# Patient Record
Sex: Male | Born: 1995 | Race: White | Hispanic: No | Marital: Single | Smoking: Never smoker
Health system: Southern US, Community
[De-identification: ages and names within clinical notes are randomized; demographics above are authoritative.]

## PROBLEM LIST (undated history)

## (undated) DIAGNOSIS — F909 Attention-deficit hyperactivity disorder, unspecified type: Secondary | ICD-10-CM

## (undated) HISTORY — DX: Attention-deficit hyperactivity disorder, unspecified type: F90.9

---
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
## 2013-11-16 ENCOUNTER — Ambulatory Visit (INDEPENDENT_AMBULATORY_CARE_PROVIDER_SITE_OTHER): Payer: 59 | Admitting: Family Medicine

## 2013-11-16 ENCOUNTER — Ambulatory Visit: Payer: 59

## 2013-11-16 VITALS — BP 110/66 | HR 67 | Temp 98.1°F | Resp 16 | Ht 71.5 in | Wt 154.4 lb

## 2013-11-16 DIAGNOSIS — IMO0002 Reserved for concepts with insufficient information to code with codable children: Secondary | ICD-10-CM

## 2013-11-16 DIAGNOSIS — S0091XA Abrasion of unspecified part of head, initial encounter: Secondary | ICD-10-CM

## 2013-11-16 DIAGNOSIS — R110 Nausea: Secondary | ICD-10-CM

## 2013-11-16 DIAGNOSIS — M79609 Pain in unspecified limb: Secondary | ICD-10-CM

## 2013-11-16 DIAGNOSIS — M79643 Pain in unspecified hand: Secondary | ICD-10-CM

## 2013-11-16 DIAGNOSIS — S62339A Displaced fracture of neck of unspecified metacarpal bone, initial encounter for closed fracture: Secondary | ICD-10-CM

## 2013-11-16 DIAGNOSIS — S62309A Unspecified fracture of unspecified metacarpal bone, initial encounter for closed fracture: Secondary | ICD-10-CM

## 2013-11-16 MED ORDER — HYDROCODONE-ACETAMINOPHEN 5-325 MG PO TABS: 1.0000 | ORAL_TABLET | Freq: Four times a day (QID) | ORAL | Status: DC | PRN

## 2013-11-16 NOTE — Patient Instructions (Addendum)
Boxer's Fracture You have a break (fracture) of the fifth metacarpal bone. This is commonly called a boxer's fracture. This is the bone in the hand where the little finger attaches. The fracture is in the end of that bone, closest to the little finger. It is usually caused when you hit an object with a clenched fist. Often, the knuckle is pushed down by the impact. Sometimes, the fracture rotates out of position. A boxer's fracture will usually heal within 6 weeks, if it is treated properly and protected from re-injury. Surgery is sometimes needed. A cast, splint, or bulky hand dressing may be used to protect and immobilize a boxer's fracture. Do not remove this device or dressing until your caregiver approves. Keep your hand elevated, and apply ice packs for 15-20 minutes every 2 hours, for the first 2 days. Elevation and ice help reduce swelling and relieve pain. See your caregiver, or an orthopedic specialist, for follow-up care within the next 10 days. This is to make sure your fracture is healing properly. Document Released: 08/24/2005 Document Revised: 11/16/2011 Document Reviewed: 02/11/2007 [facility] Patient Information 2014 [place], [place]. Concussion, Adult A concussion, or closed-head injury, is a brain injury caused by a direct blow to the head or by a quick and sudden movement (jolt) of the head or neck. Concussions are usually not life-threatening. Even so, the effects of a concussion can be serious. If you have had a concussion before, you are more likely to experience concussion-like symptoms after a direct blow to the head.  CAUSES   Direct blow to the head, such as from running into another player during a soccer game, being hit in a fight, or hitting your head on a hard surface.  A jolt of the head or neck that causes the brain to move back and forth inside the skull, such as in a car crash. SIGNS AND SYMPTOMS  The signs of a concussion can be hard to notice. Early on, they may be  missed by you, family members, and health care providers. You may look fine but act or feel differently. Symptoms are usually temporary, but they may last for days, weeks, or even longer. Some symptoms may appear right away while others may not show up for hours or days. Every head injury is different. Symptoms include:   Mild to moderate headaches that will not go away.  A feeling of pressure inside your head.  Having more trouble than usual:   Learning or remembering things you have heard.  Answering questions.  Paying attention or concentrating.   Organizing daily tasks.   Making decisions and solving problems.   Slowness in thinking, acting or reacting, speaking, or reading.   Getting lost or being easily confused.   Feeling tired all the time or lacking energy (fatigued).   Feeling drowsy.   Sleep disturbances.   Sleeping more than usual.   Sleeping less than usual.   Trouble falling asleep.   Trouble sleeping (insomnia).   Loss of balance or feeling lightheaded or dizzy.   Nausea or vomiting.   Numbness or tingling.   Increased sensitivity to:   Sounds.   Lights.   Distractions.   Vision problems or eyes that tire easily.   Diminished sense of taste or smell.   Ringing in the ears.   Mood changes such as feeling sad or anxious.   Becoming easily irritated or angry for little or no reason.   Lack of motivation.  Seeing or hearing things other people  do not see or hear (hallucinations). DIAGNOSIS  Your health care provider can usually diagnose a concussion based on a description of your injury and symptoms. He or she will ask whether you passed out (lost consciousness) and whether you are having trouble remembering events that happened right before and during your injury.  Your evaluation might include:   A brain scan to look for signs of injury to the brain. Even if the test shows no injury, you may still have a  concussion.   Blood tests to be sure other problems are not present. TREATMENT   Concussions are usually treated in an emergency department, in urgent care, or at a clinic. You may need to stay in the hospital overnight for further treatment.   Tell your health care provider if you are taking any medicines, including prescription medicines, over-the-counter medicines, and natural remedies. Some medicines, such as blood thinners (anticoagulants) and aspirin, may increase the chance of complications. Also tell your health care provider whether you have had alcohol or are taking illegal drugs. This information may affect treatment.  Your health care provider will send you home with important instructions to follow.  How fast you will recover from a concussion depends on many factors. These factors include how severe your concussion is, what part of your brain was injured, your age, and how healthy you were before the concussion.  Most people with mild injuries recover fully. Recovery can take time. In general, recovery is slower in older persons. Also, persons who have had a concussion in the past or have other medical problems may find that it takes longer to recover from their current injury. HOME CARE INSTRUCTIONS  General Instructions  Carefully follow the directions your health care provider gave you.  Only take over-the-counter or prescription medicines for pain, discomfort, or fever as directed by your health care provider.  Take only those medicines that your health care provider has approved.  Do not drink alcohol until your health care provider says you are well enough to do so. Alcohol and certain other drugs may slow your recovery and can put you at risk of further injury.  If it is harder than usual to remember things, write them down.  If you are easily distracted, try to do one thing at a time. For example, do not try to watch TV while fixing dinner.  Talk with family  members or close friends when making important decisions.  Keep all follow-up appointments. Repeated evaluation of your symptoms is recommended for your recovery.  Watch your symptoms and tell others to do the same. Complications sometimes occur after a concussion. Older adults with a brain injury may have a higher risk of serious complications such as of a blood clot on the brain.  Tell your teachers, school nurse, school counselor, coach, athletic trainer, or work Production designer, theatre/television/filmmanager about your injury, symptoms, and restrictions. Tell them about what you can or cannot do. They should watch for:   Increased problems with attention or concentration.   Increased difficulty remembering or learning new information.   Increased time needed to complete tasks or assignments.   Increased irritability or decreased ability to cope with stress.   Increased symptoms.   Rest. Rest helps the brain to heal. Make sure you:  Get plenty of sleep at night. Avoid staying up late at night.  Keep the same bedtime hours on weekends and weekdays.  Rest during the day. Take daytime naps or rest breaks when you feel tired.  Limit activities that require a lot of thought or concentration. These includes   Doing homework or job-related work.   Watching TV.   Working on the computer.  Avoid any situation where there is potential for another head injury (football, hockey, soccer, basketball, martial arts, downhill snow sports and horseback riding). Your condition will get worse every time you experience a concussion. You should avoid these activities until you are evaluated by the appropriate follow-up caregivers. Returning To Your Regular Activities You will need to return to your normal activities slowly, not all at once. You must give your body and brain enough time for recovery.  Do not return to sports or other athletic activities until your health care provider tells you it is safe to do so.  Ask your  health care provider when you can drive, ride a bicycle, or operate heavy machinery. Your ability to react may be slower after a brain injury. Never do these activities if you are dizzy.  Ask your health care provider about when you can return to work or school. Preventing Another Concussion It is very important to avoid another brain injury, especially before you have recovered. In rare cases, another injury can lead to permanent brain damage, brain swelling, or death. The risk of this is greatest during the first 7 10 days after a head injury. Avoid injuries by:   Wearing a seat belt when riding in a car.   Drinking alcohol only in moderation.   Wearing a helmet when biking, skiing, skateboarding, skating, or doing similar activities.  Avoiding activities that could lead to a second concussion, such as contact or recreational sports, until your health care provider says it is OK.  Taking safety measures in your home.   Remove clutter and tripping hazards from floors and stairways.   Use grab bars in bathrooms and handrails by stairs.   Place non-slip mats on floors and in bathtubs.   Improve lighting in dim areas. SEEK MEDICAL CARE IF:   You have increased problems paying attention or concentrating.   You have increased difficulty remembering or learning new information.   You need more time to complete tasks or assignments than before.   You have increased irritability or decreased ability to cope with stress.  You have more symptoms than before. Seek medical care if you have any of the following symptoms for more than 2 weeks after your injury:   Lasting (chronic) headaches.   Dizziness or balance problems.   Nausea.  Vision problems.   Increased sensitivity to noise or light.   Depression or mood swings.   Anxiety or irritability.   Memory problems.   Difficulty concentrating or paying attention.   Sleep problems.   Feeling tired all the  time. SEEK IMMEDIATE MEDICAL CARE IF:   You have severe or worsening headaches. These may be a sign of a blood clot in the brain.  You have weakness (even if only in one hand, leg, or part of the face).  You have numbness.  You have decreased coordination.   You vomit repeatedly.  You have increased sleepiness.  One pupil is larger than the other.   You have convulsions.   You have slurred speech.   You have increased confusion. This may be a sign of a blood clot in the brain.  You have increased restlessness, agitation, or irritability.   You are unable to recognize people or places.   You have neck pain.   It is difficult to  wake you up.   You have unusual behavior changes.   You lose consciousness. MAKE SURE YOU:   Understand these instructions.  Will watch your condition.  Will get help right away if you are not doing well or get worse. Document Released: 11/14/2003 Document Revised: 04/26/2013 Document Reviewed: 03/16/2013 [facility] Patient Information 2014 [place], [place]. Head Injury, Adult You have received a head injury. It does not appear serious at this time. Headaches and vomiting are common following head injury. It should be easy to awaken from sleeping. Sometimes it is necessary for you to stay in the emergency department for a while for observation. Sometimes admission to the hospital may be needed. After injuries such as yours, most problems occur within the first 24 hours, but side effects may occur up to 7 10 days after the injury. It is important for you to carefully monitor your condition and contact your health care provider or seek immediate medical care if there is a change in your condition. WHAT ARE THE TYPES OF HEAD INJURIES? Head injuries can be as minor as a bump. Some head injuries can be more severe. More severe head injuries include:  A jarring injury to the brain (concussion).  A bruise of the brain (contusion). This mean  there is bleeding in the brain that can cause swelling.  A cracked skull (skull fracture).  Bleeding in the brain that collects, clots, and forms a bump (hematoma). WHAT CAUSES A HEAD INJURY? A serious head injury is most likely to happen to someone who is in a car wreck and is not wearing a seat belt. Other causes of major head injuries include bicycle or motorcycle accidents, sports injuries, and falls. HOW ARE HEAD INJURIES DIAGNOSED? A complete history of the event leading to the injury and your current symptoms will be helpful in diagnosing head injuries. Many times, pictures of the brain, such as CT or MRI are needed to see the extent of the injury. Often, an overnight hospital stay is necessary for observation.  WHEN SHOULD I SEEK IMMEDIATE MEDICAL CARE?  You should get help right away if:  You have confusion or drowsiness.  You feel sick to your stomach (nauseous) or have continued, forceful vomiting.  You have dizziness or unsteadiness that is getting worse.  You have severe, continued headaches not relieved by medicine. Only take over-the-counter or prescription medicines for pain, fever, or discomfort as directed by your health care provider.  You do not have normal function of the arms or legs or are unable to walk.  You notice changes in the black spots in the center of the colored part of your eye (pupil).  You have a clear or bloody fluid coming from your nose or ears.  You have a loss of vision. During the next 24 hours after the injury, you must stay with someone who can watch you for the warning signs. This person should contact local emergency services (911 in the [place] if you have seizures, you become unconscious, or you are unable to wake up. HOW CAN I PREVENT A HEAD INJURY IN THE FUTURE? The most important factor for preventing major head injuries is avoiding motor vehicle accidents. To minimize the potential for damage to your head, it is crucial to wear seat  belts while riding in motor vehicles. Wearing helmets while bike riding and playing collision sports (like football) is also helpful. Also, avoiding dangerous activities around the house will further help reduce your risk of head injury.  WHEN CAN  I RETURN TO NORMAL ACTIVITIES AND ATHLETICS? You should be reevaluated by your health care provider before returning to these activities. If you have any of the following symptoms, you should not return to activities or contact sports until 1 week after the symptoms have stopped:  Persistent headache.  Dizziness or vertigo.  Poor attention and concentration.  Confusion.  Memory problems.  Nausea or vomiting.  Fatigue or tire easily.  Irritability.  Intolerant of bright lights or loud noises.  Anxiety or depression.  Disturbed sleep. MAKE SURE YOU:   Understand these instructions.  Will watch your condition.  Will get help right away if you are not doing well or get worse. Document Released: 08/24/2005 Document Revised: 06/14/2013 Document Reviewed: 05/01/2013 [facility] Patient Information 2014 [place], [place].
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
## 2014-12-29 ENCOUNTER — Emergency Department (HOSPITAL_COMMUNITY): Payer: 59 | Admitting: Certified Registered Nurse Anesthetist

## 2014-12-29 ENCOUNTER — Emergency Department (HOSPITAL_COMMUNITY): Payer: 59

## 2014-12-29 ENCOUNTER — Encounter (HOSPITAL_COMMUNITY)
Admission: EM | Disposition: A | Discharge status: Home / Self Care | Payer: Self-pay | Source: Home / Self Care | Attending: Emergency Medicine

## 2014-12-29 ENCOUNTER — Ambulatory Visit (HOSPITAL_COMMUNITY)
Admission: EM | Admit: 2014-12-29 | Discharge: 2014-12-30 | Disposition: A | Discharge status: Home / Self Care | Payer: 59 | Attending: General Surgery | Admitting: General Surgery

## 2014-12-29 ENCOUNTER — Encounter (HOSPITAL_COMMUNITY): Payer: Self-pay | Admitting: Nurse Practitioner

## 2014-12-29 DIAGNOSIS — R1031 Right lower quadrant pain: Secondary | ICD-10-CM

## 2014-12-29 DIAGNOSIS — K3580 Unspecified acute appendicitis: Secondary | ICD-10-CM | POA: Insufficient documentation

## 2014-12-29 DIAGNOSIS — K353 Acute appendicitis with localized peritonitis, without perforation or gangrene: Secondary | ICD-10-CM

## 2014-12-29 DIAGNOSIS — F909 Attention-deficit hyperactivity disorder, unspecified type: Secondary | ICD-10-CM | POA: Insufficient documentation

## 2014-12-29 HISTORY — PX: LAPAROSCOPIC APPENDECTOMY: SHX408

## 2014-12-29 LAB — CBC WITH DIFFERENTIAL/PLATELET
BASOS PCT: 0 % (ref 0–1)
Basophils Absolute: 0 10*3/uL (ref 0.0–0.1)
EOS ABS: 0 10*3/uL (ref 0.0–0.7)
Eosinophils Relative: 0 % (ref 0–5)
HCT: 47.5 % (ref 39.0–52.0)
Hemoglobin: 16 g/dL (ref 13.0–17.0)
LYMPHS ABS: 0.7 10*3/uL (ref 0.7–4.0)
LYMPHS PCT: 6 % — AB (ref 12–46)
MCH: 29.5 pg (ref 26.0–34.0)
MCHC: 33.7 g/dL (ref 30.0–36.0)
MCV: 87.5 fL (ref 78.0–100.0)
Monocytes Absolute: 0.4 10*3/uL (ref 0.1–1.0)
Monocytes Relative: 3 % (ref 3–12)
NEUTROS PCT: 91 % — AB (ref 43–77)
Neutro Abs: 10.9 10*3/uL — ABNORMAL HIGH (ref 1.7–7.7)
PLATELETS: 191 10*3/uL (ref 150–400)
RBC: 5.43 MIL/uL (ref 4.22–5.81)
RDW: 12.6 % (ref 11.5–15.5)
WBC: 12 10*3/uL — AB (ref 4.0–10.5)

## 2014-12-29 LAB — COMPREHENSIVE METABOLIC PANEL
ALBUMIN: 4.5 g/dL (ref 3.5–5.2)
ALT: 24 U/L (ref 0–53)
AST: 26 U/L (ref 0–37)
Alkaline Phosphatase: 70 U/L (ref 39–117)
Anion gap: 10 (ref 5–15)
BILIRUBIN TOTAL: 0.9 mg/dL (ref 0.3–1.2)
BUN: 12 mg/dL (ref 6–23)
CO2: 26 mmol/L (ref 19–32)
Calcium: 9.6 mg/dL (ref 8.4–10.5)
Chloride: 101 mmol/L (ref 96–112)
Creatinine, Ser: 0.88 mg/dL (ref 0.50–1.35)
GFR calc Af Amer: >90 mL/min (ref >90)
Glucose, Bld: 119 mg/dL — ABNORMAL HIGH (ref 70–99)
Potassium: 4.1 mmol/L (ref 3.5–5.1)
SODIUM: 137 mmol/L (ref 135–145)
Total Protein: 7.4 g/dL (ref 6.0–8.3)

## 2014-12-29 LAB — URINALYSIS, ROUTINE W REFLEX MICROSCOPIC
BILIRUBIN URINE: NEGATIVE
Glucose, UA: NEGATIVE mg/dL
HGB URINE DIPSTICK: NEGATIVE
Ketones, ur: 40 mg/dL — AB
Leukocytes, UA: NEGATIVE
Nitrite: NEGATIVE
PROTEIN: NEGATIVE mg/dL
Specific Gravity, Urine: 1.028 (ref 1.005–1.030)
UROBILINOGEN UA: 0.2 mg/dL (ref 0.0–1.0)
pH: 7.5 (ref 5.0–8.0)

## 2014-12-29 SURGERY — APPENDECTOMY, LAPAROSCOPIC
Anesthesia: General | Site: Abdomen

## 2014-12-29 MED ORDER — DEXTROSE 5 % IV SOLN
2.0000 g | INTRAVENOUS | Status: DC
  Administered 2014-12-29: 2 g via INTRAVENOUS
  Filled 2014-12-29 (×2): qty 2

## 2014-12-29 MED ORDER — PROMETHAZINE HCL 25 MG/ML IJ SOLN
6.2500 mg | Freq: Once | INTRAMUSCULAR | Status: AC
  Administered 2014-12-29: 6.25 mg via INTRAVENOUS

## 2014-12-29 MED ORDER — FENTANYL CITRATE (PF) 100 MCG/2ML IJ SOLN
INTRAMUSCULAR | Status: DC | PRN
  Administered 2014-12-29: 50 ug via INTRAVENOUS
  Administered 2014-12-29: 100 ug via INTRAVENOUS
  Administered 2014-12-29 (×2): 50 ug via INTRAVENOUS

## 2014-12-29 MED ORDER — SODIUM CHLORIDE 0.9 % IV SOLN
INTRAVENOUS | Status: DC
  Administered 2014-12-30: via INTRAVENOUS

## 2014-12-29 MED ORDER — LACTATED RINGERS IV SOLN
INTRAVENOUS | Status: DC | PRN
  Administered 2014-12-29 (×2): via INTRAVENOUS

## 2014-12-29 MED ORDER — IOHEXOL 300 MG/ML  SOLN
80.0000 mL | Freq: Once | INTRAMUSCULAR | Status: AC | PRN
  Administered 2014-12-29: 80 mL via INTRAVENOUS

## 2014-12-29 MED ORDER — IBUPROFEN 200 MG PO TABS
200.0000 mg | ORAL_TABLET | Freq: Four times a day (QID) | ORAL | Status: DC | PRN
  Filled 2014-12-29: qty 2

## 2014-12-29 MED ORDER — SODIUM CHLORIDE 0.9 % IR SOLN
Status: DC | PRN
  Administered 2014-12-29: 1

## 2014-12-29 MED ORDER — SUCCINYLCHOLINE CHLORIDE 20 MG/ML IJ SOLN
INTRAMUSCULAR | Status: AC
  Filled 2014-12-29: qty 2

## 2014-12-29 MED ORDER — FENTANYL CITRATE (PF) 250 MCG/5ML IJ SOLN
INTRAMUSCULAR | Status: AC
  Filled 2014-12-29: qty 5

## 2014-12-29 MED ORDER — IBUPROFEN 100 MG/5ML PO SUSP
200.0000 mg | Freq: Four times a day (QID) | ORAL | Status: DC | PRN
  Filled 2014-12-29: qty 20

## 2014-12-29 MED ORDER — DEXTROSE 5 % IV SOLN
2.0000 g | INTRAVENOUS | Status: DC
  Filled 2014-12-29: qty 2

## 2014-12-29 MED ORDER — SODIUM CHLORIDE 0.9 % IV BOLUS (SEPSIS)
1000.0000 mL | Freq: Once | INTRAVENOUS | Status: AC
  Administered 2014-12-29: 1000 mL via INTRAVENOUS

## 2014-12-29 MED ORDER — HYDROMORPHONE HCL 1 MG/ML IJ SOLN
1.0000 mg | Freq: Once | INTRAMUSCULAR | Status: AC
  Administered 2014-12-29: 1 mg via INTRAVENOUS
  Filled 2014-12-29: qty 1

## 2014-12-29 MED ORDER — MORPHINE SULFATE 4 MG/ML IJ SOLN
4.0000 mg | Freq: Once | INTRAMUSCULAR | Status: AC
  Administered 2014-12-29: 4 mg via INTRAVENOUS
  Filled 2014-12-29: qty 1

## 2014-12-29 MED ORDER — ONDANSETRON HCL 4 MG/2ML IJ SOLN
INTRAMUSCULAR | Status: DC | PRN
Start: 2014-12-29 — End: 2014-12-29
  Administered 2014-12-29: 4 mg via INTRAVENOUS

## 2014-12-29 MED ORDER — NEOSTIGMINE METHYLSULFATE 10 MG/10ML IV SOLN
INTRAVENOUS | Status: DC | PRN
  Administered 2014-12-29: 3 mg via INTRAVENOUS

## 2014-12-29 MED ORDER — ONDANSETRON HCL 4 MG/2ML IJ SOLN
4.0000 mg | Freq: Once | INTRAMUSCULAR | Status: AC
  Administered 2014-12-29: 4 mg via INTRAVENOUS
  Filled 2014-12-29: qty 2

## 2014-12-29 MED ORDER — OXYCODONE HCL 5 MG PO TABS: 5.0000 mg | ORAL_TABLET | Freq: Once | ORAL | Status: DC | PRN

## 2014-12-29 MED ORDER — MIDAZOLAM HCL 2 MG/2ML IJ SOLN
INTRAMUSCULAR | Status: AC
  Filled 2014-12-29: qty 2

## 2014-12-29 MED ORDER — MEPERIDINE HCL 25 MG/ML IJ SOLN: 6.2500 mg | INTRAMUSCULAR | Status: DC | PRN

## 2014-12-29 MED ORDER — DEXAMETHASONE SODIUM PHOSPHATE 4 MG/ML IJ SOLN
INTRAMUSCULAR | Status: DC | PRN
  Administered 2014-12-29: 4 mg via INTRAVENOUS

## 2014-12-29 MED ORDER — HYDROMORPHONE HCL 1 MG/ML IJ SOLN
0.5000 mg | Freq: Once | INTRAMUSCULAR | Status: AC
  Administered 2014-12-29: 0.5 mg via INTRAVENOUS
  Filled 2014-12-29: qty 1

## 2014-12-29 MED ORDER — MIDAZOLAM HCL 5 MG/5ML IJ SOLN
INTRAMUSCULAR | Status: DC | PRN
  Administered 2014-12-29: 2 mg via INTRAVENOUS

## 2014-12-29 MED ORDER — DEXAMETHASONE SODIUM PHOSPHATE 4 MG/ML IJ SOLN
INTRAMUSCULAR | Status: AC
  Filled 2014-12-29: qty 2

## 2014-12-29 MED ORDER — PROPOFOL 10 MG/ML IV BOLUS
INTRAVENOUS | Status: AC
  Filled 2014-12-29: qty 20

## 2014-12-29 MED ORDER — GLYCOPYRROLATE 0.2 MG/ML IJ SOLN
INTRAMUSCULAR | Status: AC
  Filled 2014-12-29: qty 2

## 2014-12-29 MED ORDER — NEOSTIGMINE METHYLSULFATE 10 MG/10ML IV SOLN
INTRAVENOUS | Status: AC
  Filled 2014-12-29: qty 3

## 2014-12-29 MED ORDER — KETOROLAC TROMETHAMINE 30 MG/ML IJ SOLN: 30.0000 mg | Freq: Once | INTRAMUSCULAR | Status: DC | PRN

## 2014-12-29 MED ORDER — PROMETHAZINE HCL 25 MG/ML IJ SOLN
INTRAMUSCULAR | Status: AC
  Filled 2014-12-29: qty 1

## 2014-12-29 MED ORDER — PROPOFOL 10 MG/ML IV BOLUS
INTRAVENOUS | Status: DC | PRN
Start: 2014-12-29 — End: 2014-12-29
  Administered 2014-12-29: 200 mg via INTRAVENOUS

## 2014-12-29 MED ORDER — HYDROMORPHONE HCL 1 MG/ML IJ SOLN
INTRAMUSCULAR | Status: AC
  Filled 2014-12-29: qty 1

## 2014-12-29 MED ORDER — LIDOCAINE HCL (CARDIAC) 20 MG/ML IV SOLN
INTRAVENOUS | Status: DC | PRN
  Administered 2014-12-29: 80 mg via INTRAVENOUS

## 2014-12-29 MED ORDER — SUCCINYLCHOLINE CHLORIDE 20 MG/ML IJ SOLN
INTRAMUSCULAR | Status: DC | PRN
  Administered 2014-12-29: 110 mg via INTRAVENOUS

## 2014-12-29 MED ORDER — ARTIFICIAL TEARS OP OINT
TOPICAL_OINTMENT | OPHTHALMIC | Status: AC
  Filled 2014-12-29: qty 3.5

## 2014-12-29 MED ORDER — BUPIVACAINE-EPINEPHRINE 0.25% -1:200000 IJ SOLN
INTRAMUSCULAR | Status: DC | PRN
  Administered 2014-12-29: 14.5 mL

## 2014-12-29 MED ORDER — ROCURONIUM BROMIDE 100 MG/10ML IV SOLN
INTRAVENOUS | Status: DC | PRN
  Administered 2014-12-29: 30 mg via INTRAVENOUS

## 2014-12-29 MED ORDER — BUPIVACAINE-EPINEPHRINE (PF) 0.25% -1:200000 IJ SOLN
INTRAMUSCULAR | Status: AC
  Filled 2014-12-29: qty 30

## 2014-12-29 MED ORDER — ONDANSETRON HCL 4 MG/2ML IJ SOLN
INTRAMUSCULAR | Status: AC
  Filled 2014-12-29: qty 2

## 2014-12-29 MED ORDER — IOHEXOL 300 MG/ML  SOLN
25.0000 mL | Freq: Once | INTRAMUSCULAR | Status: AC | PRN
  Administered 2014-12-29: 25 mL via ORAL

## 2014-12-29 MED ORDER — OXYCODONE HCL 5 MG/5ML PO SOLN: 5.0000 mg | Freq: Once | ORAL | Status: DC | PRN

## 2014-12-29 MED ORDER — SUCCINYLCHOLINE CHLORIDE 20 MG/ML IJ SOLN
INTRAMUSCULAR | Status: AC
  Filled 2014-12-29: qty 1

## 2014-12-29 MED ORDER — ROCURONIUM BROMIDE 50 MG/5ML IV SOLN
INTRAVENOUS | Status: AC
  Filled 2014-12-29: qty 1

## 2014-12-29 MED ORDER — ETOMIDATE 2 MG/ML IV SOLN
INTRAVENOUS | Status: AC
  Filled 2014-12-29: qty 10

## 2014-12-29 MED ORDER — HYDROMORPHONE HCL 1 MG/ML IJ SOLN
0.2500 mg | INTRAMUSCULAR | Status: DC | PRN
  Administered 2014-12-29 (×3): 0.5 mg via INTRAVENOUS

## 2014-12-29 MED ORDER — GLYCOPYRROLATE 0.2 MG/ML IJ SOLN
INTRAMUSCULAR | Status: DC | PRN
  Administered 2014-12-29: .5 mg via INTRAVENOUS

## 2014-12-29 SURGICAL SUPPLY — 36 items
APPLIER CLIP ROT 10 11.4 M/L (STAPLE)
APR CLP MED LRG 11.4X10 (STAPLE)
BAG SPEC RTRVL LRG 6X4 10 (ENDOMECHANICALS) ×1
BLADE SURG ROTATE 9660 (MISCELLANEOUS) IMPLANT
CANISTER SUCTION 2500CC (MISCELLANEOUS) ×3 IMPLANT
CHLORAPREP W/TINT 26ML (MISCELLANEOUS) ×3 IMPLANT
CLIP APPLIE ROT 10 11.4 M/L (STAPLE) IMPLANT
COVER SURGICAL LIGHT HANDLE (MISCELLANEOUS) ×3 IMPLANT
CUTTER LINEAR ENDO 35 ART FLEX (STAPLE) ×2 IMPLANT
DRAPE LAPAROSCOPIC ABDOMINAL (DRAPES) ×3 IMPLANT
ELECT REM PT RETURN 9FT ADLT (ELECTROSURGICAL) ×3
ELECTRODE REM PT RTRN 9FT ADLT (ELECTROSURGICAL) ×1 IMPLANT
GLOVE BIOGEL PI IND STRL 8 (GLOVE) ×1 IMPLANT
GLOVE BIOGEL PI INDICATOR 8 (GLOVE) ×4
GLOVE ECLIPSE 7.5 STRL STRAW (GLOVE) ×3 IMPLANT
GOWN STRL REUS W/ TWL LRG LVL3 (GOWN DISPOSABLE) ×3 IMPLANT
GOWN STRL REUS W/TWL LRG LVL3 (GOWN DISPOSABLE) ×9
KIT BASIN OR (CUSTOM PROCEDURE TRAY) ×3 IMPLANT
KIT ROOM TURNOVER OR (KITS) ×3 IMPLANT
LIQUID BAND (GAUZE/BANDAGES/DRESSINGS) ×3 IMPLANT
NS IRRIG 1000ML POUR BTL (IV SOLUTION) ×3 IMPLANT
PAD ARMBOARD 7.5X6 YLW CONV (MISCELLANEOUS) ×6 IMPLANT
POUCH SPECIMEN RETRIEVAL 10MM (ENDOMECHANICALS) ×3 IMPLANT
SCALPEL HARMONIC ACE (MISCELLANEOUS) ×3 IMPLANT
SET IRRIG TUBING LAPAROSCOPIC (IRRIGATION / IRRIGATOR) ×3 IMPLANT
SLEEVE ENDOPATH XCEL 5M (ENDOMECHANICALS) ×3 IMPLANT
SPECIMEN JAR SMALL (MISCELLANEOUS) ×3 IMPLANT
SURESTEP FOLEY TRAY SYSTEM ×2 IMPLANT
SUT MNCRL AB 4-0 PS2 18 (SUTURE) ×3 IMPLANT
TOWEL OR 17X24 6PK STRL BLUE (TOWEL DISPOSABLE) ×3 IMPLANT
TOWEL OR 17X26 10 PK STRL BLUE (TOWEL DISPOSABLE) ×3 IMPLANT
TRAY FOLEY CATH 16FR SILVER (SET/KITS/TRAYS/PACK) ×3 IMPLANT
TRAY LAPAROSCOPIC (CUSTOM PROCEDURE TRAY) ×3 IMPLANT
TROCAR XCEL BLUNT TIP 100MML (ENDOMECHANICALS) ×3 IMPLANT
TROCAR XCEL NON-BLD 5MMX100MML (ENDOMECHANICALS) ×3 IMPLANT
TUBING INSUFFLATION (TUBING) ×3 IMPLANT

## 2014-12-29 NOTE — Anesthesia Preprocedure Evaluation (Addendum)
Anesthesia Evaluation  Patient identified by MRN, date of birth, ID band Patient awake    Reviewed: Allergy & Precautions, NPO status , Patient's Chart, lab work & pertinent test results  Airway Mallampati: I  TM Distance: >3 FB Neck ROM: Full    Dental  (+) Teeth Intact, Dental Advisory Given   Pulmonary  breath sounds clear to auscultation        Cardiovascular Rhythm:Regular Rate:Normal     Neuro/Psych ADHD   GI/Hepatic   Endo/Other    Renal/GU      Musculoskeletal   Abdominal   Peds  Hematology   Anesthesia Other Findings   Reproductive/Obstetrics                            Anesthesia Physical Anesthesia Plan  ASA: I and emergent  Anesthesia Plan: General   Post-op Pain Management:    Induction: Intravenous, Rapid sequence and Cricoid pressure planned  Airway Management Planned: Oral ETT  Additional Equipment:   Intra-op Plan:   Post-operative Plan: Extubation in OR  Informed Consent: I have reviewed the patients History and Physical, chart, labs and discussed the procedure including the risks, benefits and alternatives for the proposed anesthesia with the patient or authorized representative who has indicated his/her understanding and acceptance.   Dental advisory given  Plan Discussed with: CRNA, Anesthesiologist and Surgeon  Anesthesia Plan Comments:         Anesthesia Quick Evaluation

## 2014-12-29 NOTE — Op Note (Signed)
OPERATIVE REPORT  DATE OF OPERATION: 12/29/2014  PATIENT:  Samuel Lawrence  19 y.o. male  PRE-OPERATIVE DIAGNOSIS:  Acute appendicitis  POST-OPERATIVE DIAGNOSIS:  * No post-op diagnosis entered *  PROCEDURE:  Procedure(s): APPENDECTOMY LAPAROSCOPIC  SURGEON:  Surgeon(s): Jimmye NormanJames Deylan Canterbury, MD  ASSISTANT: None  ANESTHESIA:   general  EBL: <20 ml  BLOOD ADMINISTERED: none  DRAINS: none   SPECIMEN:  Source of Specimen:  Appendix  COUNTS CORRECT:  YES  PROCEDURE DETAILS: The patient was taken to the operating room and placed on the table in supine position. After an adequate general endotracheal anesthetic was administered he was prepped and draped in the usual sterile manner exposing his abdomen.  A proper timeout was performed identifying the patient and the procedure to be performed. A super umbilical midline incision was made using a #15 blade and taken down to the midline fascia. We incised the midline fascia using a #15 blade then bluntly dissected down into the peritoneal cavity with a Kelly clamp. Once this was done a pursestring suture of 0 Vicryl was passed around the fascial opening which secured in place a Hassan cannula which was used to insufflate carbon dioxide gas up to a maximal intra-abdominal pressure of 15 mmHg.  Right upper quadrant 5 mm cannula and a left lower quadrant 5 mm cannula pass under direct vision. The patient was placed in Trendelenburg position and the left side was tilted down.  The appendix was tethered to the lateral inferior aspect of the right paracolic area. These inflammatory adhesions were taken down using the harmonic scalpel. We dissected out the mesial appendix with the harmonic scalpel down to the base of the appendix at the cecum. We came across the base of the appendix using a blue cartridge articulating Endo GIA. This freed up the appendix to be retrieved using an Endo Catch bag from the umbilical site.  There was minimal to no bleeding  from the specimen side. We irrigated with a small amount of saline then flatten the patient on the table and removed all cannulas.  The supraumbilical fascia site was closed using the pursestring suture which was in place. We injected 0.25% Marcaine at all sites. We closed the supraumbilical skin using a running subcuticular stitch of 4-0 Monocryl. Dermabond Steri-Strips and Tegaderms views complete our dressings. All counts were correct.  PATIENT DISPOSITION:  PACU - hemodynamically stable.   Paulett Kaufhold 4/23/201610:52 PM

## 2014-12-29 NOTE — H&P (Signed)
Samuel Lawrence is an 19 y.o. male.   Chief Complaint: Abdominal pain HPI: Pain started in the lower abdomen this AM about 7:00.  Did not subside, came to the ED, CT done which demonstrated acute appendicitis.  Patient scheduled for surgical removal.  Past Medical History  Diagnosis Date  . ADHD (attention deficit hyperactivity disorder)     History reviewed. No pertinent past surgical history.  History reviewed. No pertinent family history. Social History:  reports that he has never smoked. He does not have any smokeless tobacco history on file. He reports that he drinks alcohol. He reports that he uses illicit drugs (Marijuana).  Allergies: No Known Allergies   (Not in a hospital admission)  Results for orders placed or performed during the hospital encounter of 12/29/14 (from the past 48 hour(s))  Comprehensive metabolic panel     Status: Abnormal   Collection Time: 12/29/14  3:52 PM  Result Value Ref Range   Sodium 137 135 - 145 mmol/L   Potassium 4.1 3.5 - 5.1 mmol/L   Chloride 101 96 - 112 mmol/L   CO2 26 19 - 32 mmol/L   Glucose, Bld 119 (H) 70 - 99 mg/dL   BUN 12 6 - 23 mg/dL   Creatinine, Ser 0.88 0.50 - 1.35 mg/dL   Calcium 9.6 8.4 - 10.5 mg/dL   Total Protein 7.4 6.0 - 8.3 g/dL   Albumin 4.5 3.5 - 5.2 g/dL   AST 26 0 - 37 U/L   ALT 24 0 - 53 U/L   Alkaline Phosphatase 70 39 - 117 U/L   Total Bilirubin 0.9 0.3 - 1.2 mg/dL   GFR calc non Af Amer >90 >90 mL/min   GFR calc Af Amer >90 >90 mL/min    Comment: (NOTE) The eGFR has been calculated using the CKD EPI equation. This calculation has not been validated in all clinical situations. eGFR's persistently <90 mL/min signify possible Chronic Kidney Disease.    Anion gap 10 5 - 15  CBC with Differential     Status: Abnormal   Collection Time: 12/29/14  3:52 PM  Result Value Ref Range   WBC 12.0 (H) 4.0 - 10.5 K/uL   RBC 5.43 4.22 - 5.81 MIL/uL   Hemoglobin 16.0 13.0 - 17.0 g/dL   HCT 47.5 39.0 - 52.0 %   MCV 87.5 78.0 - 100.0 fL   MCH 29.5 26.0 - 34.0 pg   MCHC 33.7 30.0 - 36.0 g/dL   RDW 12.6 11.5 - 15.5 %   Platelets 191 150 - 400 K/uL   Neutrophils Relative % 91 (H) 43 - 77 %   Neutro Abs 10.9 (H) 1.7 - 7.7 K/uL   Lymphocytes Relative 6 (L) 12 - 46 %   Lymphs Abs 0.7 0.7 - 4.0 K/uL   Monocytes Relative 3 3 - 12 %   Monocytes Absolute 0.4 0.1 - 1.0 K/uL   Eosinophils Relative 0 0 - 5 %   Eosinophils Absolute 0.0 0.0 - 0.7 K/uL   Basophils Relative 0 0 - 1 %   Basophils Absolute 0.0 0.0 - 0.1 K/uL  Urinalysis, Routine w reflex microscopic     Status: Abnormal   Collection Time: 12/29/14  5:49 PM  Result Value Ref Range   Color, Urine AMBER (A) YELLOW    Comment: BIOCHEMICALS MAY BE AFFECTED BY COLOR   APPearance TURBID (A) CLEAR   Specific Gravity, Urine 1.028 1.005 - 1.030   pH 7.5 5.0 - 8.0   Glucose,  UA NEGATIVE NEGATIVE mg/dL   Hgb urine dipstick NEGATIVE NEGATIVE   Bilirubin Urine NEGATIVE NEGATIVE   Ketones, ur 40 (A) NEGATIVE mg/dL   Protein, ur NEGATIVE NEGATIVE mg/dL   Urobilinogen, UA 0.2 0.0 - 1.0 mg/dL   Nitrite NEGATIVE NEGATIVE   Leukocytes, UA NEGATIVE NEGATIVE   Ct Abdomen Pelvis W Contrast  12/29/2014   CLINICAL DATA:  Right lower quadrant abdominal pain  EXAM: CT ABDOMEN AND PELVIS WITH CONTRAST  TECHNIQUE: Multidetector CT imaging of the abdomen and pelvis was performed using the standard protocol following bolus administration of intravenous contrast.  CONTRAST:  15m OMNIPAQUE IOHEXOL 300 MG/ML  SOLN  COMPARISON:  None.  FINDINGS: Lower chest:  Lung bases are clear.  Hepatobiliary: Liver and gallbladder appear unremarkable.  Pancreas: Normal  Spleen: Normal  Adrenals/Urinary Tract: Adrenal glands and kidneys appear normal. No hydroureteronephrosis. No radiopaque renal or ureteral calculus.  Stomach/Bowel: There is mild periappendiceal stranding with appendiceal dilatation to 0.7 cm image 64. Probable appendicolith noted. A linear radiopacity within the appendix  image 66 raises question of possible ingested foreign body. Retrocecal appendiceal location. No surrounding free fluid. Large bowel and small bowel are unremarkable. Stomach appears normal.  Vascular/Lymphatic: No lymphadenopathy.  No aortic aneurysm.  Other: No free air or fluid.  Musculoskeletal: No acute osseous abnormality.  IMPRESSION: Retro cecal mild appendiceal dilatation and surrounding stranding compatible with acute appendicitis. No rim enhancing fluid collection to suggest abscess. Palpable appendicolith at the appendix tip. A linear radiopaque filling defect within the appendix could represent an appendicolith although ingested foreign body could appear similar.   Electronically Signed   By: GConchita ParisM.D.   On: 12/29/2014 18:46    Review of Systems  Constitutional: Positive for chills. Negative for fever.  HENT: Negative.   Eyes: Negative.   Respiratory: Negative.   Cardiovascular: Negative.   Gastrointestinal: Positive for nausea, vomiting and abdominal pain.  Genitourinary: Negative.   Musculoskeletal: Negative.   Skin: Negative.   Neurological: Negative.   Endo/Heme/Allergies: Negative.   Psychiatric/Behavioral: Negative.     Blood pressure 118/53, pulse 84, temperature 98.5 F (36.9 C), temperature source Rectal, resp. rate 21, height _0  (1.803 m), weight 74.957 kg (165 lb 4 oz), SpO2 96 %. Physical Exam  Constitutional: He is oriented to person, place, and time. He appears well-developed and well-nourished.  HENT:  Head: Normocephalic and atraumatic.  Eyes: Conjunctivae and EOM are normal. Pupils are equal, round, and reactive to light.  Neck: Normal range of motion. Neck supple.  Cardiovascular: Normal rate, regular rhythm and normal heart sounds.   Respiratory: Effort normal and breath sounds normal.  GI: Soft. Normal appearance. Bowel sounds are decreased. There is tenderness in the right lower quadrant. There is tenderness at McBurney's point. There is no  rigidity and no guarding.  Musculoskeletal: Normal range of motion.  Neurological: He is alert and oriented to person, place, and time. He has normal reflexes.  Skin: Skin is warm and dry.  Psychiatric: He has a normal mood and affect. His behavior is normal. Judgment and thought content normal.     Assessment/Plan Acute appendicitis, no apparent rupture by CT and clinically For laparoscopic appendectomy ASAP.  Adelie Croswell 12/29/2014, 8:33 PM

## 2014-12-29 NOTE — Anesthesia Postprocedure Evaluation (Signed)
  Anesthesia Post-op Note  Patient: Samuel Lawrence  Procedure(s) Performed: Procedure(s): APPENDECTOMY LAPAROSCOPIC (N/A)  Patient Location: PACU  Anesthesia Type: General   Level of Consciousness: awake, alert  and oriented  Airway and Oxygen Therapy: Patient Spontanous Breathing  Post-op Pain: mild  Post-op Assessment: Post-op Vital signs reviewed  Post-op Vital Signs: Reviewed  Last Vitals:  Filed Vitals:   12/29/14 2251  BP: 139/86  Pulse: 106  Temp: 36.8 C  Resp: 20    Complications: No apparent anesthesia complications

## 2014-12-29 NOTE — Anesthesia Procedure Notes (Signed)
Procedure Name: Intubation Date/Time: 12/29/2014 9:47 PM Performed by: Julianne RiceBILOTTA, Samuel Nicoletti Z Pre-anesthesia Checklist: Patient identified, Patient being monitored, Timeout performed, Emergency Drugs available and Suction available Patient Re-evaluated:Patient Re-evaluated prior to inductionOxygen Delivery Method: Circle system utilized Intubation Type: IV induction, Rapid sequence and Cricoid Pressure applied Laryngoscope Size: Mac and 3 Grade View: Grade I Tube type: Oral Tube size: 8.0 mm Number of attempts: 1 Airway Equipment and Method: Stylet Placement Confirmation: ETT inserted through vocal cords under direct vision,  breath sounds checked- equal and bilateral and positive ETCO2 Secured at: 23 cm Tube secured with: Tape Dental Injury: Teeth and Oropharynx as per pre-operative assessment

## 2014-12-29 NOTE — ED Notes (Signed)
Spoke to [employer] in pharmacy, mefoxin is being sent to the OR.

## 2014-12-29 NOTE — ED Notes (Signed)
Patient transported to CT 

## 2014-12-29 NOTE — ED Notes (Signed)
Pt reports sudden onset RLQ abd pain with n/v this am. Pt appears pale and uncomfortable, clutching abdomen. [facility] to r/o appendicitis

## 2014-12-29 NOTE — ED Notes (Signed)
Dr. Lindie SpruceWyatt, surgeon, at the bedside.

## 2014-12-29 NOTE — Transfer of Care (Signed)
Immediate Anesthesia Transfer of Care Note  Patient: Samuel Lawrence  Procedure(s) Performed: Procedure(s): APPENDECTOMY LAPAROSCOPIC (N/A)  Patient Location: PACU  Anesthesia Type:General  Level of Consciousness: awake, alert  and oriented  Airway & Oxygen Therapy: Patient Spontanous Breathing and Patient connected to nasal cannula oxygen  Post-op Assessment: Report given to RN and Post -op Vital signs reviewed and stable  Post vital signs: Reviewed and stable  Last Vitals:  Filed Vitals:   12/29/14 2100  BP: 113/51  Pulse: 93  Temp:   Resp:     Complications: No apparent anesthesia complications

## 2014-12-29 NOTE — ED Provider Notes (Signed)
CSN: [id number]     Arrival date & time 12/29/14  1536 History   First MD Initiated Contact with Patient 12/29/14 1550     Chief Complaint  Patient presents with  . Abdominal Pain     (Consider location/radiation/quality/duration/timing/severity/associated sxs/prior Treatment) HPI Comments: Patient presents to the emergency department with chief complaint of right lower quadrant abdominal pain. Patient states pain awakened him from sleep this morning. States that the pain is 9 out of 10. Nothing makes it better or worse. He reports associated nausea, vomiting, and diarrhea. He was seen at urgent care today, and was sent to the ED to rule out appendicitis. Patient denies any fevers chills. Denies any recent sick contacts. Denies any prior abdominal surgeries.  The history is provided by the patient. No language interpreter was used.    Past Medical History  Diagnosis Date  . ADHD (attention deficit hyperactivity disorder)    History reviewed. No pertinent past surgical history. History reviewed. No pertinent family history. History  Substance Use Topics  . Smoking status: Never Smoker   . Smokeless tobacco: Not on file  . Alcohol Use: Yes     Comment: social    Review of Systems  Constitutional: Negative for fever and chills.  Respiratory: Negative for shortness of breath.   Cardiovascular: Negative for chest pain.  Gastrointestinal: Positive for nausea, vomiting, abdominal pain and diarrhea. Negative for constipation.  Genitourinary: Negative for dysuria.  All other systems reviewed and are negative.     Allergies  Review of patient's allergies indicates no known allergies.  Home Medications   Prior to Admission medications   Medication Sig Start Date End Date Taking? Authorizing Provider  HYDROcodone-acetaminophen (NORCO) 5-325 MG per tablet Take 1 tablet by mouth every 6 (six) hours as needed for moderate pain. 11/16/13   Thao P Le, DO  lisdexamfetamine (VYVANSE) 40  MG capsule Take 40 mg by mouth every morning.    Historical Provider, MD   BP 114/69 mmHg  Pulse 74  Temp(Src)   Resp 16  Ht  (1.803 m)  Wt 165 lb 4 oz (74.957 kg)  BMI 23.06 kg/m2 Physical Exam  Constitutional: He is oriented to person, place, and time. He appears well-developed and well-nourished.  HENT:  Head: Normocephalic and atraumatic.  Eyes: Conjunctivae and EOM are normal. Pupils are equal, round, and reactive to light. Right eye exhibits no discharge. Left eye exhibits no discharge. No scleral icterus.  Neck: Normal range of motion. Neck supple. No JVD present.  Cardiovascular: Normal rate, regular rhythm and normal heart sounds.  Exam reveals no gallop and no friction rub.   No murmur heard. Pulmonary/Chest: Effort normal and breath sounds normal. No respiratory distress. He has no wheezes. He has no rales. He exhibits no tenderness.  Abdominal: Soft. He exhibits no distension and no mass. There is no tenderness. There is no rebound and no guarding.  Right lower quadrant very tender to palpation, otherwise diffuse abdominal tenderness  Musculoskeletal: Normal range of motion. He exhibits no edema or tenderness.  Neurological: He is alert and oriented to person, place, and time.  Skin: Skin is warm and dry.  Psychiatric: He has a normal mood and affect. His behavior is normal. Judgment and thought content normal.  Nursing note and vitals reviewed.   ED Course  Procedures (including critical care time) Results for orders placed or performed during the hospital encounter of 12/29/14  Comprehensive metabolic panel  Result Value Ref Range   Sodium 137  135 - 145 mmol/L   Potassium 4.1 3.5 - 5.1 mmol/L   Chloride 101 96 - 112 mmol/L   CO2 26 19 - 32 mmol/L   Glucose, Bld 119 (H) 70 - 99 mg/dL   BUN 12 6 - 23 mg/dL   Creatinine, Ser 1.610.88 0.50 - 1.35 mg/dL   Calcium 9.6 8.4 - 09.610.5 mg/dL   Total Protein 7.4 6.0 - 8.3 g/dL   Albumin 4.5 3.5 - 5.2 g/dL   AST 26 0 - 37  U/L   ALT 24 0 - 53 U/L   Alkaline Phosphatase 70 39 - 117 U/L   Total Bilirubin 0.9 0.3 - 1.2 mg/dL   GFR calc non Af Amer >90 >90 mL/min   GFR calc Af Amer >90 >90 mL/min   Anion gap 10 5 - 15  CBC with Differential  Result Value Ref Range   WBC 12.0 (H) 4.0 - 10.5 K/uL   RBC 5.43 4.22 - 5.81 MIL/uL   Hemoglobin 16.0 13.0 - 17.0 g/dL   HCT 04.547.5 40.939.0 - 81.152.0 %   MCV 87.5 78.0 - 100.0 fL   MCH 29.5 26.0 - 34.0 pg   MCHC 33.7 30.0 - 36.0 g/dL   RDW 91.412.6 78.211.5 - 95.615.5 %   Platelets 191 150 - 400 K/uL   Neutrophils Relative % 91 (H) 43 - 77 %   Neutro Abs 10.9 (H) 1.7 - 7.7 K/uL   Lymphocytes Relative 6 (L) 12 - 46 %   Lymphs Abs 0.7 0.7 - 4.0 K/uL   Monocytes Relative 3 3 - 12 %   Monocytes Absolute 0.4 0.1 - 1.0 K/uL   Eosinophils Relative 0 0 - 5 %   Eosinophils Absolute 0.0 0.0 - 0.7 K/uL   Basophils Relative 0 0 - 1 %   Basophils Absolute 0.0 0.0 - 0.1 K/uL  Urinalysis, Routine w reflex microscopic  Result Value Ref Range   Color, Urine AMBER (A) YELLOW   APPearance TURBID (A) CLEAR   Specific Gravity, Urine 1.028 1.005 - 1.030   pH 7.5 5.0 - 8.0   Glucose, UA NEGATIVE NEGATIVE mg/dL   Hgb urine dipstick NEGATIVE NEGATIVE   Bilirubin Urine NEGATIVE NEGATIVE   Ketones, ur 40 (A) NEGATIVE mg/dL   Protein, ur NEGATIVE NEGATIVE mg/dL   Urobilinogen, UA 0.2 0.0 - 1.0 mg/dL   Nitrite NEGATIVE NEGATIVE   Leukocytes, UA NEGATIVE NEGATIVE   Ct Abdomen Pelvis W Contrast  12/29/2014   CLINICAL DATA:  Right lower quadrant abdominal pain  EXAM: CT ABDOMEN AND PELVIS WITH CONTRAST  TECHNIQUE: Multidetector CT imaging of the abdomen and pelvis was performed using the standard protocol following bolus administration of intravenous contrast.  CONTRAST:  80mL OMNIPAQUE IOHEXOL 300 MG/ML  SOLN  COMPARISON:  None.  FINDINGS: Lower chest:  Lung bases are clear.  Hepatobiliary: Liver and gallbladder appear unremarkable.  Pancreas: Normal  Spleen: Normal  Adrenals/Urinary Tract: Adrenal glands  and kidneys appear normal. No hydroureteronephrosis. No radiopaque renal or ureteral calculus.  Stomach/Bowel: There is mild periappendiceal stranding with appendiceal dilatation to 0.7 cm image 64. Probable appendicolith noted. A linear radiopacity within the appendix image 66 raises question of possible ingested foreign body. Retrocecal appendiceal location. No surrounding free fluid. Large bowel and small bowel are unremarkable. Stomach appears normal.  Vascular/Lymphatic: No lymphadenopathy.  No aortic aneurysm.  Other: No free air or fluid.  Musculoskeletal: No acute osseous abnormality.  IMPRESSION: Retro cecal mild appendiceal dilatation and surrounding stranding compatible with acute  appendicitis. No rim enhancing fluid collection to suggest abscess. Palpable appendicolith at the appendix tip. A linear radiopaque filling defect within the appendix could represent an appendicolith although ingested foreign body could appear similar.   Electronically Signed   By: Christiana Pellant M.D.   On: 12/29/2014 18:46      EKG Interpretation None      MDM   Final diagnoses:  RLQ abdominal pain  Acute appendicitis with localized peritonitis    Patient with sudden onset right lower quadrant pain with nausea and vomiting. Suspicious for appendicitis. Will check labs, CT, and treat pain. Will reassess.  Pain improved to 4 out of 10.  Patient states that his pain is still a 45 out of 10. He has had several doses of morphine and several doses of Dilaudid. General surgery is going to see the patient.  Appreciate Dr. Lindie Spruce for admitting the patient.   Roxy Horseman, PA-C 12/29/14 1610  Gwyneth Sprout, MD 12/30/14 [phone]

## 2014-12-30 MED ORDER — OXYCODONE-ACETAMINOPHEN 5-325 MG PO TABS: 1.0000 | ORAL_TABLET | ORAL | Status: AC | PRN

## 2014-12-30 MED ORDER — HYDROMORPHONE HCL 1 MG/ML IJ SOLN
0.5000 mg | INTRAMUSCULAR | Status: DC | PRN
  Administered 2014-12-30 (×3): 1 mg via INTRAVENOUS
  Filled 2014-12-30 (×3): qty 1

## 2014-12-30 MED ORDER — OXYCODONE-ACETAMINOPHEN 5-325 MG PO TABS
1.0000 | ORAL_TABLET | ORAL | Status: DC | PRN
  Administered 2014-12-30 (×3): 2 via ORAL
  Filled 2014-12-30 (×3): qty 2

## 2014-12-30 MED ORDER — ONDANSETRON HCL 4 MG/2ML IJ SOLN: 4.0000 mg | Freq: Four times a day (QID) | INTRAMUSCULAR | Status: DC | PRN

## 2014-12-30 MED ORDER — ONDANSETRON HCL 4 MG PO TABS: 4.0000 mg | ORAL_TABLET | Freq: Four times a day (QID) | ORAL | Status: DC | PRN

## 2014-12-30 NOTE — Discharge Summary (Signed)
  Patient ID: Samuel Lawrence [id number] 18 y.o. [birth year]  Admit date: 12/29/2014  Discharge date and time: 12/30/2014  Admitting Physician: Jimmye NormanWyatt,James  Discharge Physician: Ernestene MentionINGRAM,[name]  Admission Diagnoses: RLQ abdominal pain [R10.31] Acute appendicitis with localized peritonitis [K35.3]  Discharge Diagnoses: Acute appendicitis  Operations: Procedure(s): APPENDECTOMY LAPAROSCOPIC  Admission Condition: fair  Discharged Condition: good  Indication for Admission: This is an otherwise healthy 19 year old man who presented to the emergency room with a 12 hour history of lower abdominal pain.  he was found to have localized abdominal tenderness in the right lower quadrant area.  CT scan was consistent with acute uncomplicated appendicitis. WBC elevated 12,000. Urinalysis clear. He was admitted for IV hydration, IV antibiotics and appendectomy  Hospital Course: On the day of admission the patient was prepared for surgery as above taken to the operating room and underwent a laparoscopic appendectomy. Dr. Lindie SpruceWyatt felt that this was an uncomplicated acute appendicitis. The following morning the patient was stable and doing well other than incisional pain. His and bloating in the halls, voiding uneventfully, and beginning to tolerate diet. His abdomen was soft with active bowel sounds and the wounds looked good. We decided to plan for discharge this afternoon after lunch assuming pain control was good. He was given a prescription for Percocet for pain. Diet and activities were discussed with him and both of his parents. He was asked to make an appointment with Dr. Lindie SpruceWyatt in about 2 weeks.  Consults: None  Significant Diagnostic Studies: Lab work and CT scan  Treatments: surgery: Laparoscopic appendectomy  Disposition: Home  Patient Instructions:    Medication List    STOP taking these medications        HYDROcodone-acetaminophen 5-325 MG per tablet  Commonly known as:  NORCO       TAKE these medications        oxyCODONE-acetaminophen 5-325 MG per tablet  Commonly known as:  PERCOCET/ROXICET  Take 1-2 tablets by mouth every 4 (four) hours as needed for moderate pain.     REFRESH CLEANSER Liqd  Apply 1 application topically as needed (for contacts).        Activity: activity as tolerated Diet: low fat, low cholesterol diet Wound Care: as directed  Follow-up:  With DR. Wyatt in 2 weeks.  Signed: Angelia MouldHaywood M. Derrell LollingIngram, M.D., FACS General and minimally invasive surgery Breast and Colorectal Surgery  12/30/2014, 8:52 AM

## 2014-12-30 NOTE — Discharge Instructions (Signed)
-  see above 

## 2014-12-31 ENCOUNTER — Encounter (HOSPITAL_COMMUNITY): Payer: Self-pay | Admitting: General Surgery

## 2015-02-14 ENCOUNTER — Emergency Department (HOSPITAL_COMMUNITY)
Admission: EM | Admit: 2015-02-14 | Discharge: 2015-02-14 | Disposition: A | Discharge status: Home / Self Care | Payer: 59 | Attending: Emergency Medicine | Admitting: Emergency Medicine

## 2015-02-14 ENCOUNTER — Emergency Department (HOSPITAL_COMMUNITY): Payer: 59

## 2015-02-14 ENCOUNTER — Encounter (HOSPITAL_COMMUNITY): Payer: Self-pay | Admitting: Emergency Medicine

## 2015-02-14 DIAGNOSIS — F410 Panic disorder [episodic paroxysmal anxiety] without agoraphobia: Secondary | ICD-10-CM | POA: Diagnosis not present

## 2015-02-14 DIAGNOSIS — F1310 Sedative, hypnotic or anxiolytic abuse, uncomplicated: Secondary | ICD-10-CM | POA: Insufficient documentation

## 2015-02-14 DIAGNOSIS — F419 Anxiety disorder, unspecified: Secondary | ICD-10-CM | POA: Diagnosis present

## 2015-02-14 DIAGNOSIS — R55 Syncope and collapse: Secondary | ICD-10-CM | POA: Insufficient documentation

## 2015-02-14 DIAGNOSIS — F1210 Cannabis abuse, uncomplicated: Secondary | ICD-10-CM | POA: Insufficient documentation

## 2015-02-14 LAB — RAPID URINE DRUG SCREEN, HOSP PERFORMED
AMPHETAMINES: NOT DETECTED
Barbiturates: NOT DETECTED
Benzodiazepines: POSITIVE — AB
Cocaine: NOT DETECTED
OPIATES: NOT DETECTED
Tetrahydrocannabinol: POSITIVE — AB

## 2015-02-14 LAB — CBC WITH DIFFERENTIAL/PLATELET
Basophils Absolute: 0 10*3/uL (ref 0.0–0.1)
Basophils Relative: 1 % (ref 0–1)
EOS PCT: 3 % (ref 0–5)
Eosinophils Absolute: 0.1 10*3/uL (ref 0.0–0.7)
HCT: 48.1 % (ref 39.0–52.0)
Hemoglobin: 16.2 g/dL (ref 13.0–17.0)
LYMPHS ABS: 2 10*3/uL (ref 0.7–4.0)
Lymphocytes Relative: 41 % (ref 12–46)
MCH: 29.8 pg (ref 26.0–34.0)
MCHC: 33.7 g/dL (ref 30.0–36.0)
MCV: 88.6 fL (ref 78.0–100.0)
MONO ABS: 0.4 10*3/uL (ref 0.1–1.0)
Monocytes Relative: 8 % (ref 3–12)
Neutro Abs: 2.3 10*3/uL (ref 1.7–7.7)
Neutrophils Relative %: 47 % (ref 43–77)
Platelets: 193 10*3/uL (ref 150–400)
RBC: 5.43 MIL/uL (ref 4.22–5.81)
RDW: 12.5 % (ref 11.5–15.5)
WBC: 4.9 10*3/uL (ref 4.0–10.5)

## 2015-02-14 LAB — BASIC METABOLIC PANEL
Anion gap: 17 — ABNORMAL HIGH (ref 5–15)
BUN: 15 mg/dL (ref 6–20)
CO2: 19 mmol/L — ABNORMAL LOW (ref 22–32)
CREATININE: 0.95 mg/dL (ref 0.61–1.24)
Calcium: 10 mg/dL (ref 8.9–10.3)
Chloride: 106 mmol/L (ref 101–111)
GFR calc Af Amer: >60 mL/min (ref >60)
GFR calc non Af Amer: >60 mL/min (ref >60)
Glucose, Bld: 92 mg/dL (ref 65–99)
Potassium: 4 mmol/L (ref 3.5–5.1)
Sodium: 142 mmol/L (ref 135–145)

## 2015-02-14 LAB — ETHANOL: Alcohol, Ethyl (B): <5 mg/dL (ref <5)

## 2015-02-14 LAB — D-DIMER, QUANTITATIVE: D-Dimer, Quant: <0.27 ug/mL-FEU (ref 0.00–0.48)

## 2015-02-14 MED ORDER — AMMONIA AROMATIC IN INHA
RESPIRATORY_TRACT | Status: AC
  Administered 2015-02-14: 12:00:00
  Filled 2015-02-14: qty 10

## 2015-02-14 MED ORDER — AMMONIA AROMATIC IN INHA
RESPIRATORY_TRACT | Status: AC
  Administered 2015-02-14: 12:00:00
  Filled 2015-02-14: qty 30

## 2015-02-14 MED ORDER — LORAZEPAM 2 MG/ML IJ SOLN
1.0000 mg | Freq: Once | INTRAMUSCULAR | Status: AC
  Administered 2015-02-14: 1 mg via INTRAVENOUS
  Filled 2015-02-14: qty 1
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
## 2015-02-14 NOTE — Discharge Instructions (Signed)
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
## 2015-11-06 ENCOUNTER — Ambulatory Visit (INDEPENDENT_AMBULATORY_CARE_PROVIDER_SITE_OTHER): Payer: BLUE CROSS/BLUE SHIELD | Admitting: Physician Assistant

## 2015-11-06 ENCOUNTER — Ambulatory Visit (INDEPENDENT_AMBULATORY_CARE_PROVIDER_SITE_OTHER): Payer: BLUE CROSS/BLUE SHIELD

## 2015-11-06 VITALS — BP 122/68 | HR 74 | Temp 98.4°F | Resp 16 | Ht 71.0 in | Wt 172.4 lb

## 2015-11-06 DIAGNOSIS — S93602A Unspecified sprain of left foot, initial encounter: Secondary | ICD-10-CM

## 2015-11-06 DIAGNOSIS — M79672 Pain in left foot: Secondary | ICD-10-CM

## 2015-11-06 NOTE — Patient Instructions (Addendum)
Because you received an x-ray today, you will receive an invoice from [facility]. Please contact [facility] at [phone] with questions or concerns regarding your invoice. Our billing staff will not be able to assist you with those questions.  Foot Sprain A foot sprain is an injury to one of the strong bands of tissue (ligaments) that connect and support the many bones in your feet. The ligament can be stretched too much or it can tear. A tear can be either partial or complete. The severity of the sprain depends on how much of the ligament was damaged or torn. CAUSES A foot sprain is usually caused by suddenly twisting or pivoting your foot. RISK FACTORS This injury is more likely to occur in people who:  Play a sport, such as basketball or football.  Exercise or play a sport without warming up.  Start a new workout or sport.  Suddenly increase how long or hard they exercise or play a sport. SYMPTOMS Symptoms of this condition start soon after an injury and include:  Pain, especially in the arch of the foot.  Bruising.  Swelling.  Inability to walk or use the foot to support body weight. DIAGNOSIS This condition is diagnosed with a medical history and physical exam. You may also have imaging tests, such as:  X-rays to make sure there are no broken bones (fractures).  MRI to see if the ligament has torn. TREATMENT Treatment varies depending on the severity of your sprain. Mild sprains can be treated with rest, ice, compression, and elevation (RICE). If your ligament is overstretched or partially torn, treatment usually involves keeping your foot in a fixed position (immobilization) for a period of time. To help you do this, your health care provider will apply a bandage, splint, or walking boot to keep your foot from moving until it heals. You may also be advised to use crutches or a scooter for a few weeks to avoid bearing weight on your foot while it is  healing. If your ligament is fully torn, you may need surgery to reconnect the ligament to the bone. After surgery, a cast or splint will be applied and will need to stay on your foot while it heals. Your health care provider may also suggest exercises or physical therapy to strengthen your foot. HOME CARE INSTRUCTIONS If You Have a Bandage, Splint, or Walking Boot:  Wear it as directed by your health care provider. Remove it only as directed by your health care provider.  Loosen the bandage, splint, or walking boot if your toes become numb and tingle, or if they turn cold and blue. Bathing  If your health care provider approves bathing and showering, cover the bandage or splint with a watertight plastic bag to protect it from water. Do not let the bandage or splint get wet. Managing Pain, Stiffness, and Swelling   If directed, apply ice to the injured area:  Put ice in a plastic bag.  Place a towel between your skin and the bag.  Leave the ice on for 20 minutes, 2-3 times per day.  Move your toes often to avoid stiffness and to lessen swelling.  Raise (elevate) the injured area above the level of your heart while you are sitting or lying down. Driving  Do not drive or operate heavy machinery while taking pain medicine.  Do not drive while wearing a bandage, splint, or walking boot on a foot that you use for driving. Activity  Rest as directed by your health  care provider.  Do not use the injured foot to support your body weight until your health care provider says that you can. Use crutches or other supportive devices as directed by your health care provider.  Ask your health care provider what activities are safe for you. Gradually increase how much and how far you walk until your health care provider says it is safe to return to full activity.  Do any exercise or physical therapy as directed by your health care provider. General Instructions  If a splint was applied, do  not put pressure on any part of it until it is fully hardened. This may take several hours.  Take medicines only as directed by your health care provider. These include over-the-counter medicines and prescription medicines.  Keep all follow-up visits as directed by your health care provider. This is important.  When you can walk without pain, wear supportive shoes that have stiff soles. Do not wear flip-flops, and do not walk barefoot. SEEK MEDICAL CARE IF:  Your pain is not controlled with medicine.  Your bruising or swelling gets worse or does not get better with treatment.  Your splint or walking boot is damaged. SEEK IMMEDIATE MEDICAL CARE IF:  Your foot is numb or blue.  Your foot feels colder than normal.   This information is not intended to replace advice given to you by your health care provider. Make sure you discuss any questions you have with your health care provider.   Document Released: 02/13/2002 Document Revised: 01/08/2015 Document Reviewed: 06/27/2014 Elsevier Interactive Patient Education [employer]

## 2015-11-12 NOTE — Progress Notes (Signed)
[facility] [address] [place] [address] [id number] [phone]  Date:  11/06/2015   Name:  Samuel Lawrence   DOB:  04-Sep-1996   MRN:  [id number]  PCP:  Juline Patch, MD   Chief Complaint  Patient presents with  . Ankle Pain    left/ x yesterday   History of Present Illness:  Samuel Lawrence is a 20 y.o. male patient who presents to [facility] for cc of left ankle pain since yesterday. No injury to his left ankle before.  He was playing soccer yesterday, where he went to pivot and rolled his foot.  He had immediate pain at the outside of his foot.  He has had no swelling or erythema.  Difficult to bear weight.  He has no numbness or tingling.      Patient Active Problem List   Diagnosis Date Noted  . Acute appendicitis 12/29/2014    Past Medical History  Diagnosis Date  . ADHD (attention deficit hyperactivity disorder)     Past Surgical History  Procedure Laterality Date  . Laparoscopic appendectomy N/A 12/29/2014    Procedure: APPENDECTOMY LAPAROSCOPIC;  Surgeon: Jimmye Norman, MD;  Location: [facility] OR;  Service: General;  Laterality: N/A;    Social History  Substance Use Topics  . Smoking status: Never Smoker   . Smokeless tobacco: None  . Alcohol Use: Yes     Comment: social    History reviewed. No pertinent family history.  No Known Allergies  Medication list has been reviewed and updated.  Current Outpatient Prescriptions on File Prior to Visit  Medication Sig Dispense Refill  . oxyCODONE-acetaminophen (PERCOCET/ROXICET) 5-325 MG per tablet Take 1-2 tablets by mouth every 4 (four) hours as needed for moderate pain. (Patient not taking: Reported on 02/14/2015) 30 tablet 0   No current facility-administered medications on file prior to visit.    ROS ROS otherwise unremarkable unless listed above.  Physical Examination: BP 122/68 mmHg  Pulse 74  Temp(Src) 98.4 F (36.9 C) (Oral)  Resp 16  Ht  (1.803 m)  Wt 172 lb 6.4 oz (78.2 kg)  BMI  24.06 kg/m2  SpO2 99% Ideal Body Weight: Weight in (lb) to have BMI = 25: 178.9  Physical Exam  Constitutional: He is oriented to person, place, and time. He appears well-developed and well-nourished. No distress.  HENT:  Head: Normocephalic and atraumatic.  Eyes: Conjunctivae and EOM are normal. Pupils are equal, round, and reactive to light.  Cardiovascular: Normal rate.   Pulmonary/Chest: Effort normal. No respiratory distress.  Musculoskeletal:       Left ankle: He exhibits normal range of motion, no swelling and normal pulse. No tenderness. No lateral malleolus, no medial malleolus, no AITFL, no CF ligament, no posterior TFL and no proximal fibula tenderness found. Achilles tendon normal. Achilles tendon exhibits normal Thompson's test results.  Pain at the 4th and 5th metatarsal.  Pain with active plantar flexion.  Normal rom.    Neurological: He is alert and oriented to person, place, and time.  Skin: Skin is warm and dry. He is not diaphoretic.  Psychiatric: He has a normal mood and affect. His behavior is normal.    Dg Foot Complete Left  11/06/2015  CLINICAL DATA:  Foot pain.  Soccer injury. EXAM: LEFT FOOT - COMPLETE 3+ VIEW COMPARISON:  09/30/2005. FINDINGS: No acute bony or joint abnormality identified. No evidence of fracture or dislocation. Tiny sclerotic density noted in the distal phalanx of the left great  toe, most likely a bone island. Fracture of the left second metatarsal prior study of 09/30/2005 has healed. IMPRESSION: No acute abnormality identified. No evidence of fracture or dislocation. No radiopaque foreign body. Electronically Signed   By: Maisie Fushomas  Register   On: 11/06/2015 14:00     Assessment and Plan: Samuel Lawrence is a 20 y.o. male who is here today for cc of left foot pain. Advised RICE.  Placed in bandage--family declines boot.  I have advised crutches for patient comfort.  He discusses by phone with father who declines crutches as well. rtc in 10 days  if sxs do not improve.  Left foot pain - Plan: DG Foot Complete Left  Foot sprain, left, initial encounter  Trena PlattStephanie Cheronda Erck, PA-C [facility] 3/7/20175:20 PM    Trena PlattStephanie Zanasia Hickson, PA-C [facility] 11/12/2015 5:14 PM

## 2016-04-11 IMAGING — CT CT ABD-PELV W/ CM
2 of 5 series · 16 of 46 positions shown, 18 images · IV contrast (Omni 300)
Comparison: None.

CLINICAL DATA: Right lower quadrant abdominal pain

EXAM:
CT ABDOMEN AND PELVIS WITH CONTRAST
TECHNIQUE: Multidetector CT imaging of the abdomen and pelvis was performed
using the standard protocol following bolus administration of
intravenous contrast.
CONTRAST:  80mL OMNIPAQUE IOHEXOL 300 MG/ML  SOLN

[Series 2: abd/ pelvis 5.0 i30f 1 · axial · 0.70mm/px · z∈[-1146,-786]mm · 13 of 80 slices shown, 15 images]
[im 4/80  soft-tissue]
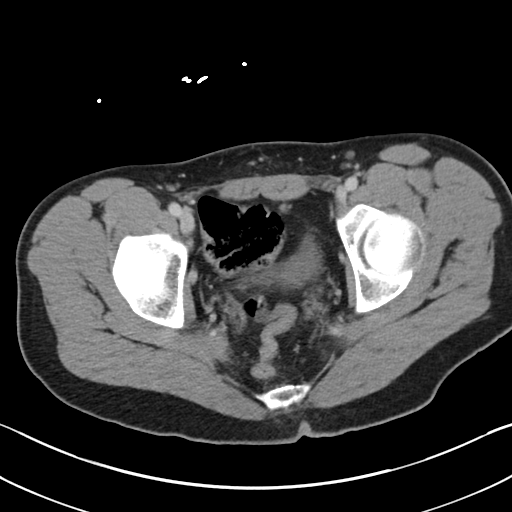
[im 4/80  bone]
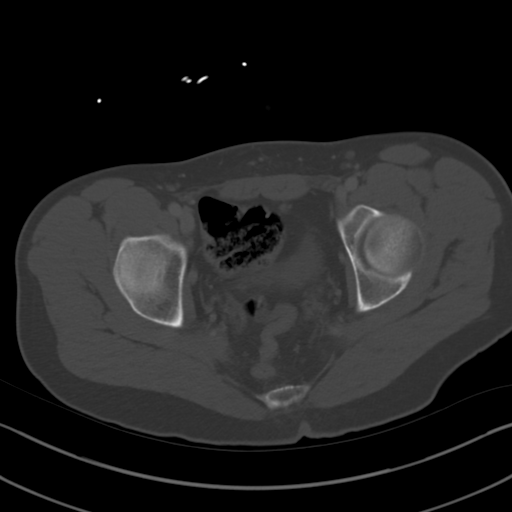
[im 11/80  soft-tissue]
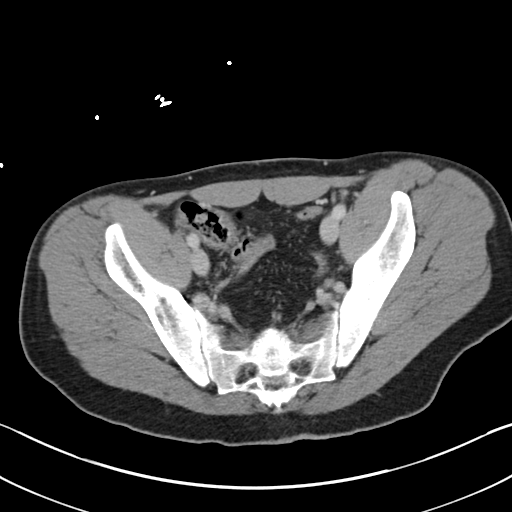
[im 18/80  soft-tissue]
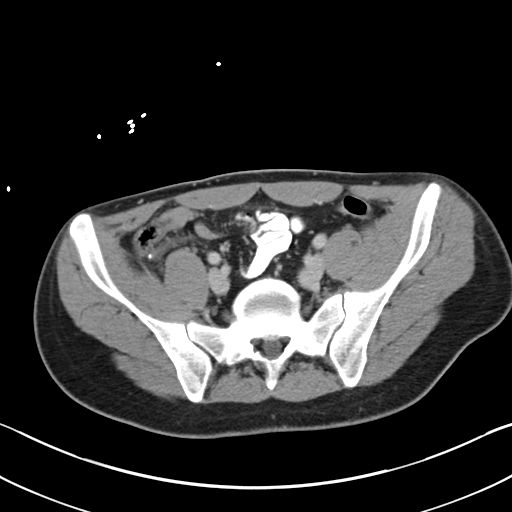
[im 22/80  soft-tissue]
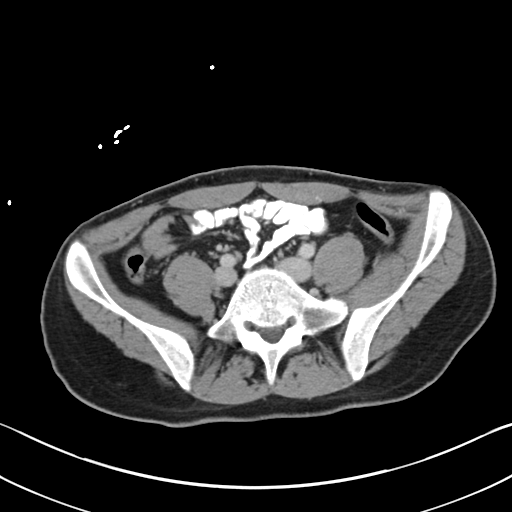
[im 29/80  soft-tissue]
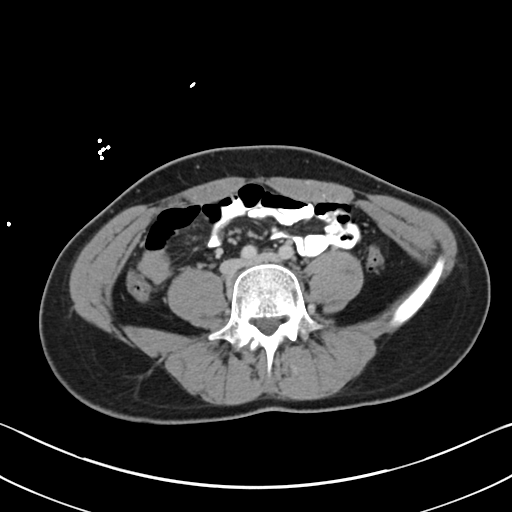
[im 33/80  soft-tissue]
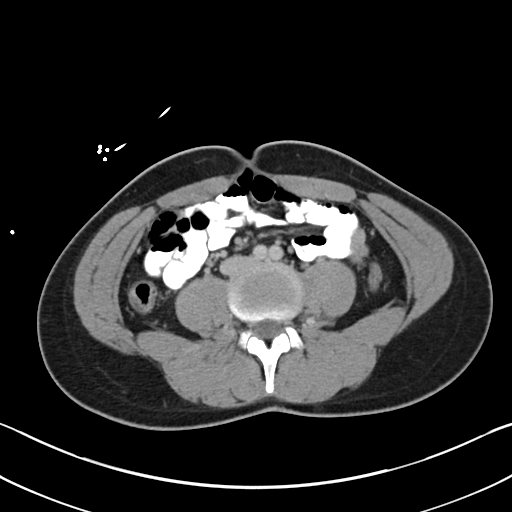
[im 40/80  soft-tissue]
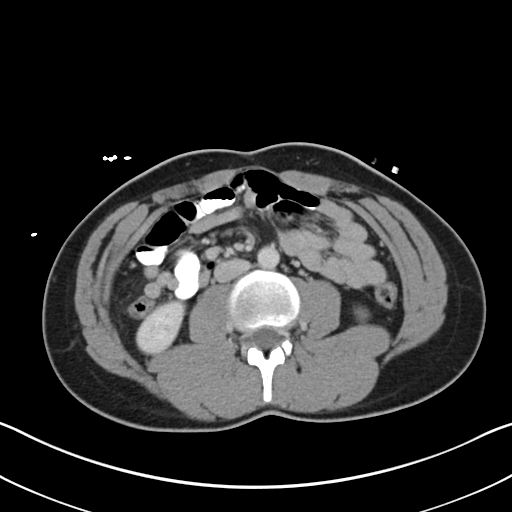
[im 47/80  soft-tissue]
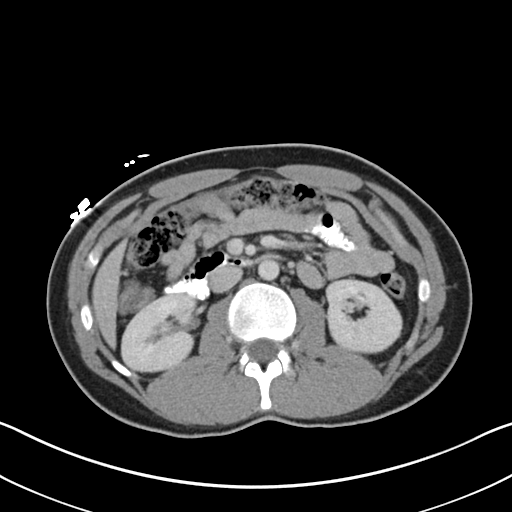
[im 51/80  soft-tissue]
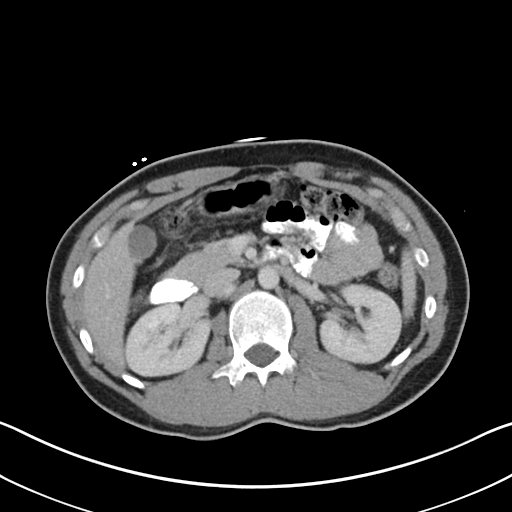
[im 51/80  bone]
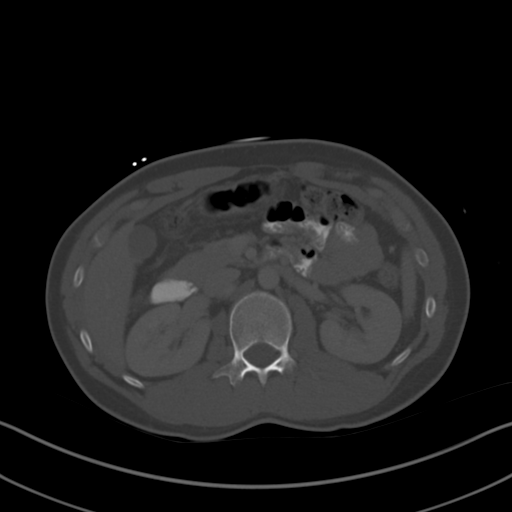
[im 58/80  soft-tissue]
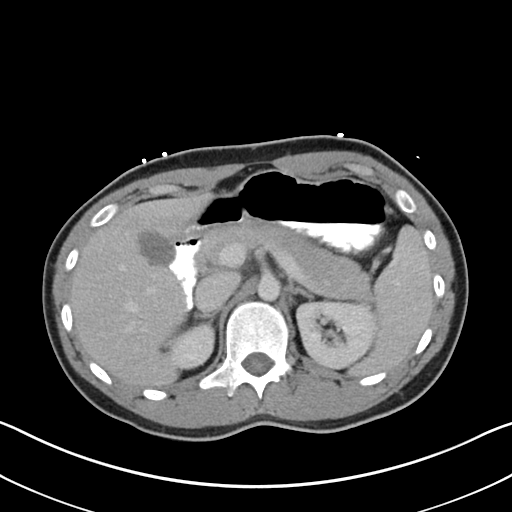
[im 62/80  soft-tissue]
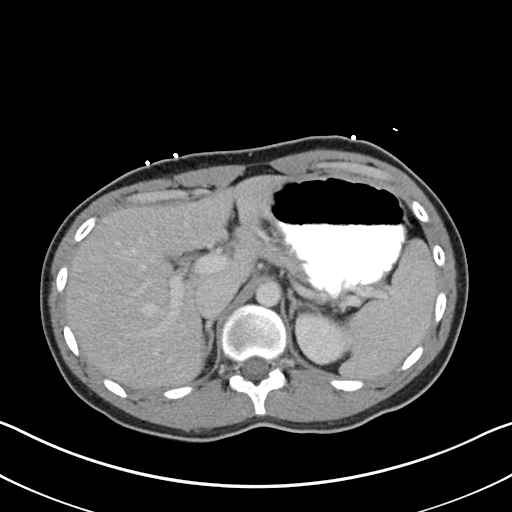
[im 69/80  soft-tissue]
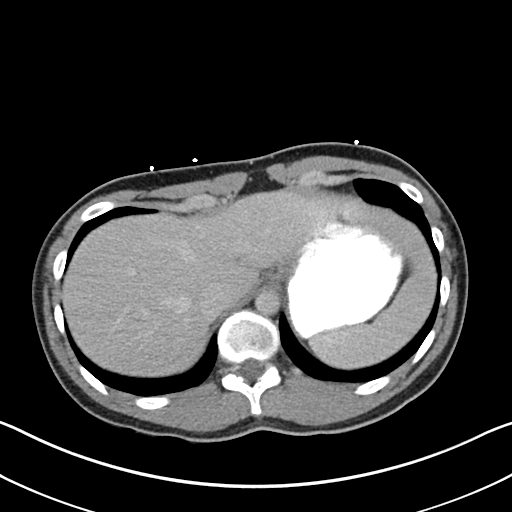
[im 76/80  soft-tissue]
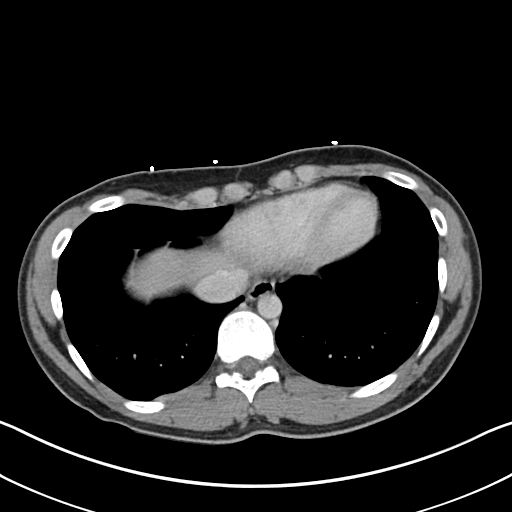

[Series 5: coronals · coronal · 0.70mm/px · 3 of 100 slices shown]
[im 34/100  soft-tissue]
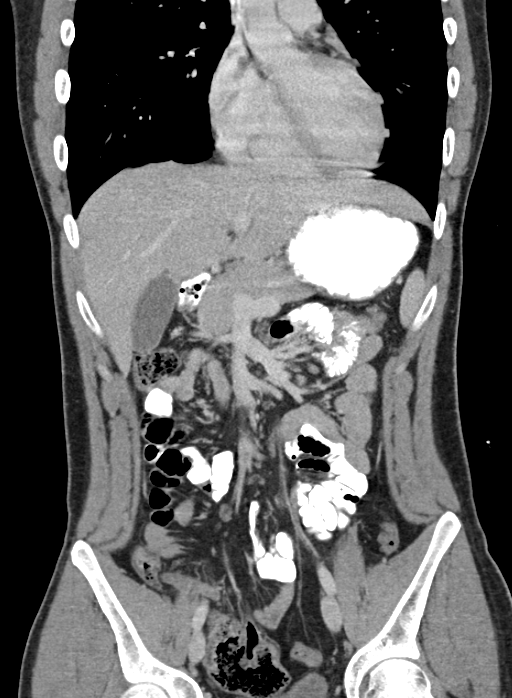
[im 45/100  soft-tissue]
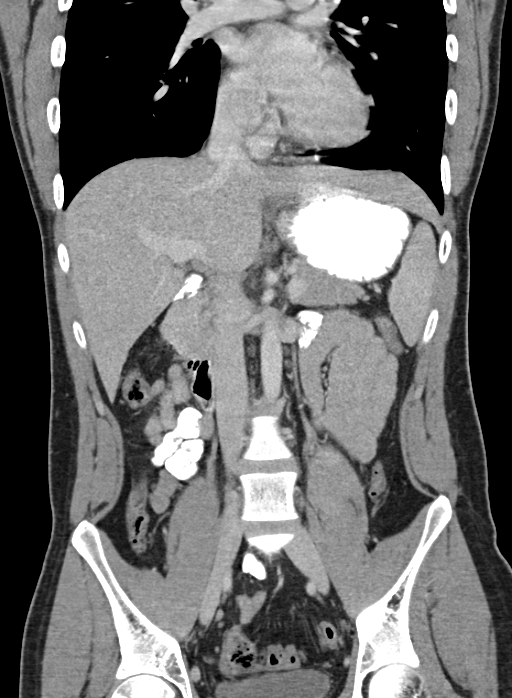
[im 56/100  soft-tissue]
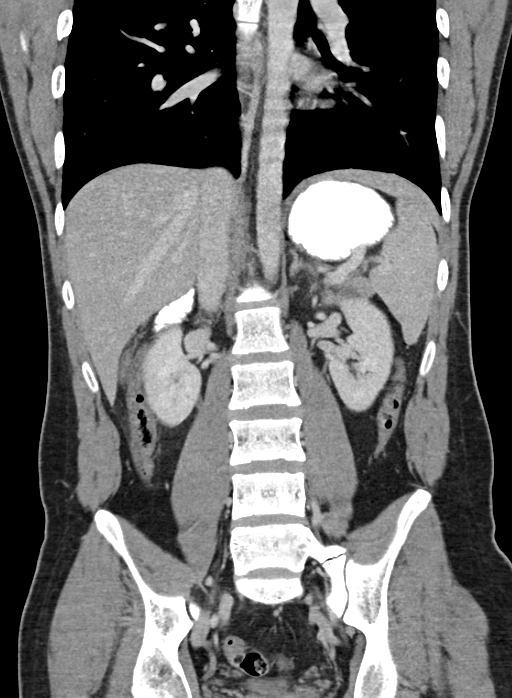

[16 of 46 positions shown; findings below may reference images not displayed]

FINDINGS: Lower chest:  Lung bases are clear.

Hepatobiliary: Liver and gallbladder appear unremarkable.

Pancreas: Normal

Spleen: Normal

Adrenals/Urinary Tract: Adrenal glands and kidneys appear normal. No
hydroureteronephrosis. No radiopaque renal or ureteral calculus.

Stomach/Bowel: There is mild periappendiceal stranding with
appendiceal dilatation to 0.7 cm image 64. Probable appendicolith
noted. A linear radiopacity within the appendix image 66 raises
question of possible ingested foreign body. Retrocecal appendiceal
location. No surrounding free fluid. Large bowel and small bowel are
unremarkable. Stomach appears normal.

Vascular/Lymphatic: No lymphadenopathy.  No aortic aneurysm.

Other: No free air or fluid.

Musculoskeletal: No acute osseous abnormality.
IMPRESSION: Retro cecal mild appendiceal dilatation and surrounding stranding
compatible with acute appendicitis. No rim enhancing fluid
collection to suggest abscess. Palpable appendicolith at the
appendix tip. A linear radiopaque filling defect within the appendix
could represent an appendicolith although ingested foreign body
could appear similar.

## 2018-01-30 ENCOUNTER — Encounter (HOSPITAL_BASED_OUTPATIENT_CLINIC_OR_DEPARTMENT_OTHER): Payer: Self-pay | Admitting: *Deleted

## 2018-01-30 ENCOUNTER — Emergency Department (HOSPITAL_BASED_OUTPATIENT_CLINIC_OR_DEPARTMENT_OTHER): Payer: Self-pay

## 2018-01-30 ENCOUNTER — Other Ambulatory Visit: Payer: Self-pay

## 2018-01-30 ENCOUNTER — Emergency Department (HOSPITAL_BASED_OUTPATIENT_CLINIC_OR_DEPARTMENT_OTHER)
Admission: EM | Admit: 2018-01-30 | Discharge: 2018-01-30 | Disposition: A | Discharge status: Home / Self Care | Payer: Self-pay | Attending: Emergency Medicine | Admitting: Emergency Medicine

## 2018-01-30 DIAGNOSIS — Y9389 Activity, other specified: Secondary | ICD-10-CM | POA: Insufficient documentation

## 2018-01-30 DIAGNOSIS — S62339A Displaced fracture of neck of unspecified metacarpal bone, initial encounter for closed fracture: Secondary | ICD-10-CM

## 2018-01-30 DIAGNOSIS — Y999 Unspecified external cause status: Secondary | ICD-10-CM | POA: Insufficient documentation

## 2018-01-30 DIAGNOSIS — W228XXA Striking against or struck by other objects, initial encounter: Secondary | ICD-10-CM | POA: Insufficient documentation

## 2018-01-30 DIAGNOSIS — Y92009 Unspecified place in unspecified non-institutional (private) residence as the place of occurrence of the external cause: Secondary | ICD-10-CM | POA: Insufficient documentation

## 2018-01-30 DIAGNOSIS — S62336A Displaced fracture of neck of fifth metacarpal bone, right hand, initial encounter for closed fracture: Secondary | ICD-10-CM | POA: Insufficient documentation

## 2018-01-30 MED ORDER — KETOROLAC TROMETHAMINE 60 MG/2ML IM SOLN
30.0000 mg | Freq: Once | INTRAMUSCULAR | Status: AC
  Administered 2018-01-30: 30 mg via INTRAMUSCULAR
  Filled 2018-01-30: qty 2

## 2018-01-30 MED ORDER — TRAMADOL HCL 50 MG PO TABS: 50.0000 mg | ORAL_TABLET | Freq: Four times a day (QID) | ORAL | 0 refills | Status: AC | PRN

## 2018-01-30 MED ORDER — NAPROXEN 500 MG PO TABS: 500.0000 mg | ORAL_TABLET | Freq: Two times a day (BID) | ORAL | 0 refills | Status: AC
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
## 2019-01-23 ENCOUNTER — Other Ambulatory Visit: Payer: Self-pay

## 2019-01-23 ENCOUNTER — Encounter (HOSPITAL_BASED_OUTPATIENT_CLINIC_OR_DEPARTMENT_OTHER): Payer: Self-pay | Admitting: *Deleted

## 2019-01-23 ENCOUNTER — Emergency Department (HOSPITAL_BASED_OUTPATIENT_CLINIC_OR_DEPARTMENT_OTHER)
Admission: EM | Admit: 2019-01-23 | Discharge: 2019-01-23 | Disposition: A | Discharge status: Home / Self Care | Payer: 59 | Attending: Emergency Medicine | Admitting: Emergency Medicine

## 2019-01-23 DIAGNOSIS — Z79899 Other long term (current) drug therapy: Secondary | ICD-10-CM | POA: Insufficient documentation

## 2019-01-23 DIAGNOSIS — L03317 Cellulitis of buttock: Secondary | ICD-10-CM

## 2019-01-23 DIAGNOSIS — K610 Anal abscess: Secondary | ICD-10-CM | POA: Insufficient documentation

## 2019-01-23 DIAGNOSIS — L0231 Cutaneous abscess of buttock: Secondary | ICD-10-CM

## 2019-01-23 LAB — CBC WITH DIFFERENTIAL/PLATELET
Abs Immature Granulocytes: 0.04 10*3/uL (ref 0.00–0.07)
Basophils Absolute: 0 10*3/uL (ref 0.0–0.1)
Basophils Relative: 0 %
Eosinophils Absolute: 0.1 10*3/uL (ref 0.0–0.5)
Eosinophils Relative: 1 %
HCT: 48.5 % (ref 39.0–52.0)
Hemoglobin: 15.6 g/dL (ref 13.0–17.0)
Immature Granulocytes: 0 %
Lymphocytes Relative: 13 %
Lymphs Abs: 1.6 10*3/uL (ref 0.7–4.0)
MCH: 28.4 pg (ref 26.0–34.0)
MCHC: 32.2 g/dL (ref 30.0–36.0)
MCV: 88.3 fL (ref 80.0–100.0)
Monocytes Absolute: 1.2 10*3/uL — ABNORMAL HIGH (ref 0.1–1.0)
Monocytes Relative: 10 %
Neutro Abs: 9.8 10*3/uL — ABNORMAL HIGH (ref 1.7–7.7)
Neutrophils Relative %: 76 %
Platelets: 216 10*3/uL (ref 150–400)
RBC: 5.49 MIL/uL (ref 4.22–5.81)
RDW: 11.9 % (ref 11.5–15.5)
WBC: 12.9 10*3/uL — ABNORMAL HIGH (ref 4.0–10.5)
nRBC: 0 % (ref 0.0–0.2)

## 2019-01-23 LAB — BASIC METABOLIC PANEL
Anion gap: 10 (ref 5–15)
BUN: 10 mg/dL (ref 6–20)
CO2: 25 mmol/L (ref 22–32)
Calcium: 9.2 mg/dL (ref 8.9–10.3)
Chloride: 104 mmol/L (ref 98–111)
Creatinine, Ser: 1 mg/dL (ref 0.61–1.24)
GFR calc Af Amer: >60 mL/min (ref >60)
GFR calc non Af Amer: >60 mL/min (ref >60)
Glucose, Bld: 105 mg/dL — ABNORMAL HIGH (ref 70–99)
Potassium: 3.9 mmol/L (ref 3.5–5.1)
Sodium: 139 mmol/L (ref 135–145)

## 2019-01-23 LAB — LACTIC ACID, PLASMA: Lactic Acid, Venous: 1.5 mmol/L (ref 0.5–1.9)

## 2019-01-23 MED ORDER — FENTANYL CITRATE (PF) 100 MCG/2ML IJ SOLN
100.0000 ug | Freq: Once | INTRAMUSCULAR | Status: AC
  Administered 2019-01-23: 100 ug via INTRAVENOUS
  Filled 2019-01-23: qty 2

## 2019-01-23 MED ORDER — CLINDAMYCIN HCL 150 MG PO CAPS
450.0000 mg | ORAL_CAPSULE | Freq: Once | ORAL | Status: AC
  Administered 2019-01-23: 18:00:00 450 mg via ORAL
  Filled 2019-01-23: qty 3

## 2019-01-23 MED ORDER — LIDOCAINE-EPINEPHRINE (PF) 2 %-1:200000 IJ SOLN
INTRAMUSCULAR | Status: AC
  Filled 2019-01-23: qty 10

## 2019-01-23 MED ORDER — CLINDAMYCIN HCL 150 MG PO CAPS: 450.0000 mg | ORAL_CAPSULE | Freq: Three times a day (TID) | ORAL | 0 refills | Status: AC

## 2019-01-23 MED ORDER — OXYCODONE HCL 5 MG PO TABS: 5.0000 mg | ORAL_TABLET | ORAL | 0 refills | Status: AC | PRN

## 2019-01-23 MED ORDER — OXYCODONE-ACETAMINOPHEN 5-325 MG PO TABS
1.0000 | ORAL_TABLET | Freq: Once | ORAL | Status: AC
  Administered 2019-01-23: 18:00:00 1 via ORAL
  Filled 2019-01-23: qty 1

## 2019-01-23 MED ORDER — LIDOCAINE-EPINEPHRINE (PF) 2 %-1:200000 IJ SOLN
20.0000 mL | Freq: Once | INTRAMUSCULAR | Status: AC
  Administered 2019-01-23: 20 mL
  Filled 2019-01-23: qty 20

## 2019-01-23 NOTE — ED Provider Notes (Signed)
[facility] EMERGENCY DEPARTMENT Provider Note   CSN: [id number] Arrival date & time: 01/23/19  1554    History   Chief Complaint Chief Complaint  Patient presents with  . Hemorrhoids    HPI Samuel Lawrence is a 23 y.o. male is here for evaluation of rectal pain.  Onset 5 days ago.  Severe.  Constant, significantly worse with walking, sitting, laying on his back.  Yesterday he was only able to sleep 3 hours due to the severe pain.  He has been doing sits baths and taking melatonin to help with the symptoms without relief.  He thinks it is a hemorrhoid because there is a bulge.  Today he noted some associated chills.  He denies any issues with constipation, straining, hemorrhoids in the past.  Denies associated fever, diarrhea, hematochezia, pain with bowel movements.  No alleviating factors.     HPI  Past Medical History:  Diagnosis Date  . ADHD (attention deficit hyperactivity disorder)     Patient Active Problem List   Diagnosis Date Noted  . Acute appendicitis 12/29/2014    Past Surgical History:  Procedure Laterality Date  . LAPAROSCOPIC APPENDECTOMY N/A 12/29/2014   Procedure: APPENDECTOMY LAPAROSCOPIC;  Surgeon: Jimmye Norman, MD;  Location: [facility] OR;  Service: General;  Laterality: N/A;        Home Medications    Prior to Admission medications   Medication Sig Start Date End Date Taking? Authorizing Provider  clindamycin (CLEOCIN) 150 MG capsule Take 3 capsules (450 mg total) by mouth 3 (three) times daily for 10 days. 01/23/19 02/02/19  Liberty Handy, PA-C  naproxen (NAPROSYN) 500 MG tablet Take 1 tablet (500 mg total) by mouth 2 (two) times daily. 01/30/18   Palumbo, April, MD  oxyCODONE (OXY IR/ROXICODONE) 5 MG immediate release tablet Take 1 tablet (5 mg total) by mouth every 4 (four) hours as needed for up to 3 days for severe pain. 01/23/19 01/26/19  Liberty Handy, PA-C  oxyCODONE-acetaminophen (PERCOCET/ROXICET) 5-325 MG per tablet Take 1-2  tablets by mouth every 4 (four) hours as needed for moderate pain. Patient not taking: Reported on 02/14/2015 12/30/14   Claud Kelp, MD  traMADol (ULTRAM) 50 MG tablet Take 1 tablet (50 mg total) by mouth every 6 (six) hours as needed for severe pain. 01/30/18   Palumbo, April, MD    Family History No family history on file.  Social History Social History   Tobacco Use  . Smoking status: Never Smoker  . Smokeless tobacco: Never Used  Substance Use Topics  . Alcohol use: Yes    Comment: social  . Drug use: Yes    Types: Marijuana     Allergies   Patient has no known allergies.   Review of Systems Review of Systems  Constitutional: Positive for chills.  Genitourinary:       Rectal pain and swelling  All other systems reviewed and are negative.    Physical Exam Updated Vital Signs BP 136/76 (BP Location: Left Arm)   Pulse (!) 104   Temp 98.2 F (36.8 C) (Oral)   Resp 16   Ht  (1.803 m)   Wt 81.6 kg   SpO2 100%   BMI 25.10 kg/m   Physical Exam Vitals signs and nursing note reviewed.  Constitutional:      General: He is not in acute distress.    Appearance: He is well-developed.     Comments: NAD.  HENT:     Head: Normocephalic and  atraumatic.     Right Ear: External ear normal.     Left Ear: External ear normal.     Nose: Nose normal.  Eyes:     General: No scleral icterus.    Conjunctiva/sclera: Conjunctivae normal.  Neck:     Musculoskeletal: Normal range of motion and neck supple.  Cardiovascular:     Rate and Rhythm: Normal rate and regular rhythm.     Heart sounds: Normal heart sounds. No murmur.  Pulmonary:     Effort: Pulmonary effort is normal.     Breath sounds: Normal breath sounds. No wheezing.  Genitourinary:    Rectum: Tenderness present.       Comments: Approximate 5 x 5 area of exquisite tenderness, erythema, warmth, fluctuance to the left buttock very close to the perianal area (approx 1 cm from anal verge).  There is some  streaking of erythema outwards.  No external or internal hemorrhoids palpated.  Patient had discomfort with DRE but no significant pain with internal palpation on the affected rectal wall. Musculoskeletal: Normal range of motion.        General: No deformity.  Skin:    General: Skin is warm and dry.     Capillary Refill: Capillary refill takes less than 2 seconds.  Neurological:     Mental Status: He is alert and oriented to person, place, and time.  Psychiatric:        Behavior: Behavior normal.        Thought Content: Thought content normal.        Judgment: Judgment normal.      ED Treatments / Results  Labs (all labs ordered are listed, but only abnormal results are displayed) Labs Reviewed  CBC WITH DIFFERENTIAL/PLATELET - Abnormal; Notable for the following components:      Result Value   WBC 12.9 (*)    Neutro Abs 9.8 (*)    Monocytes Absolute 1.2 (*)    All other components within normal limits  BASIC METABOLIC PANEL - Abnormal; Notable for the following components:   Glucose, Bld 105 (*)    All other components within normal limits  LACTIC ACID, PLASMA  LACTIC ACID, PLASMA    EKG None  Radiology No results found.  Procedures .Marland KitchenIncision and Drainage Date/Time: 01/23/2019 6:07 PM Performed by: Liberty Handy, PA-C Authorized by: Liberty Handy, PA-C   Consent:    Consent obtained:  Verbal   Consent given by:  Patient   Risks discussed:  Bleeding, incomplete drainage, pain and damage to other organs   Alternatives discussed:  No treatment Universal protocol:    Procedure explained and questions answered to patient or proxy's satisfaction: yes     Relevant documents present and verified: yes     Test results available and properly labeled: yes     Imaging studies available: yes     Required blood products, implants, devices, and special equipment available: yes     Site/side marked: yes     Immediately prior to procedure a time out was called: yes      Patient identity confirmed:  Verbally with patient Location:    Type:  Abscess Pre-procedure details:    Skin preparation:  Betadine Anesthesia (see MAR for exact dosages):    Anesthesia method:  Local infiltration   Local anesthetic:  Lidocaine 1% WITH epi Procedure type:    Complexity:  Complex Procedure details:    Incision types:  Single straight   Incision depth:  Dermal  Scalpel blade:  11   Wound management:  Probed and deloculated, irrigated with saline and extensive cleaning   Drainage:  Purulent   Drainage amount:  Copious   Packing materials:  1/4 in gauze Post-procedure details:    Patient tolerance of procedure:  Tolerated well, no immediate complications Comments:     Irrigated with 100 cc NS   (including critical care time)  Medications Ordered in ED Medications  fentaNYL (SUBLIMAZE) injection 100 mcg (100 mcg Intravenous Given 01/23/19 1636)  lidocaine-EPINEPHrine (XYLOCAINE W/EPI) 2 %-1:200000 (PF) injection 20 mL (20 mLs Infiltration Given 01/23/19 1639)  clindamycin (CLEOCIN) capsule 450 mg (450 mg Oral Given 01/23/19 1755)  oxyCODONE-acetaminophen (PERCOCET/ROXICET) 5-325 MG per tablet 1 tablet (1 tablet Oral Given 01/23/19 1755)     Initial Impression / Assessment and Plan / ED Course  I have reviewed the triage vital signs and the nursing notes.  Pertinent labs & imaging results that were available during my care of the patient were reviewed by me and considered in my medical decision making (see chart for details).  Clinical Course as of Jan 23 1808  Mon Jan 23, 2019  1747 WBC(!): 12.9 [CG]  1747 Lactic Acid, Venous: 1.5 [CG]  1747 In setting of significant pain, will re-check  Pulse Rate(!): 113 [CG]  1806 Pulse Rate(!): 104 [CG]    Clinical Course User Index [CG] Liberty HandyGibbons, Tranisha Tissue J, PA-C       History and exam is most consistent with perianal abscess with mild surrounding cellulitis.  DRE is reassuring and not consistent with deeper  infection perirectally however now reporting some chills.  His lightheadedness sounds to be related to the significant pain.  We will obtain CBC, lactic acid and plan for I&D with packing here.  Considered high level of imaging however given his exam deeper pelvic/rectal abscess is considered less likely.  Final Clinical Impressions(s) / ED Diagnoses   Lab work as above remarkable for WBC 12.9 and improving tachycardia.  Lactic acid is normal.  Other than chills patient has no other constitutional symptoms including fever.  Clinically he does not appear toxic and I suspect his heart rate is due to the pain, he is also nervous.  There was significant purulent drainage removed and almost complete resolution of fluctuance, swelling to the area after procedure.  Given clinical exam, overall well-appearing, no history of immunocompromise or diabetes I think patient can be discharged with oral antibiotics, high-dose NSAIDs and oxycodone, warm compresses.  Recommended wound check/packing removal in 72 hours.  Return precautions were given.  Patient is comfortable with this. Final diagnoses:  Perianal abscess  Cellulitis and abscess of buttock    ED Discharge Orders         Ordered    clindamycin (CLEOCIN) 150 MG capsule  3 times daily     01/23/19 1752    oxyCODONE (OXY IR/ROXICODONE) 5 MG immediate release tablet  Every 4 hours PRN     01/23/19 1752           Liberty HandyGibbons, Kischa Altice J, PA-C 01/23/19 1809    Virgina Norfolkuratolo, Adam, DO 01/23/19 1843

## 2019-01-23 NOTE — ED Triage Notes (Signed)
Hemorrhoid pain x 4 days. He is ambulatory and states he can't sit. States he is "fading in and out". Encouraged to sit if he feels he is going to pass out.

## 2019-01-23 NOTE — Discharge Instructions (Signed)
You have a perianal abscess. This is a collection of pus.  We incised and drained it today with significant improvement in swelling.   Treatment includes antibiotics, moist heat therapy and anti-inflammatory medicines   Take antibiotic as prescribed and until completed. Symptoms typically improve in 48-72 hours.   Apply moist heat (warm towel, heating pad) or massage under warm water at least twice a day to help drainage.   Any abscess can worsen, enlarge and spread infection into blood stream.  Return to the ER if you have fevers, chills, worsening swelling, redness, warmth.   Follow up with your general doctor or return in the ED in 48-72 hours for wound check and packing removal  For pain take 600 mg ibuprofen (advil, motrin) every 6 hours. For more pain control, take [phone] mg acetaminophen every 6 hours.  You can add oxycodone 5 mg every 4 hours for break through and severe pain. Oxycodone can cause constipation, so take a stool softener or miralax to prevent constipation

## 2019-01-25 ENCOUNTER — Emergency Department (HOSPITAL_COMMUNITY)
Admission: EM | Admit: 2019-01-25 | Discharge: 2019-01-25 | Disposition: A | Discharge status: Home / Self Care | Payer: PRIVATE HEALTH INSURANCE | Attending: Emergency Medicine | Admitting: Emergency Medicine

## 2019-01-25 ENCOUNTER — Emergency Department (HOSPITAL_COMMUNITY)
Admission: EM | Admit: 2019-01-25 | Discharge: 2019-01-25 | Discharge status: Absent without leave | Payer: PRIVATE HEALTH INSURANCE

## 2019-01-25 ENCOUNTER — Other Ambulatory Visit: Payer: Self-pay

## 2019-01-25 ENCOUNTER — Encounter (HOSPITAL_COMMUNITY): Payer: Self-pay

## 2019-01-25 DIAGNOSIS — Z5189 Encounter for other specified aftercare: Secondary | ICD-10-CM | POA: Insufficient documentation

## 2019-01-25 DIAGNOSIS — K611 Rectal abscess: Secondary | ICD-10-CM | POA: Diagnosis not present

## 2019-01-25 MED ORDER — MORPHINE SULFATE (PF) 2 MG/ML IV SOLN
2.0000 mg | Freq: Once | INTRAVENOUS | Status: AC
  Administered 2019-01-25: 2 mg via INTRAMUSCULAR
  Filled 2019-01-25: qty 1

## 2019-01-25 NOTE — ED Triage Notes (Signed)
Pt was seen at [facility] on 5/18 for abscess in rectal area. Pt states that abscess was drained and was placed on ABX. Pt states that he went to see  His doctor today for a f/u and was instructed to come to the ed. Pt reports 10/10 throbbing/tender pain.

## 2019-01-25 NOTE — Discharge Instructions (Addendum)
Please return for any problem. Follow up with your regular care provider as instructed.   Continue wound care as instructed. Packing should be removed tomorrow.

## 2019-01-25 NOTE — ED Provider Notes (Signed)
[facility]-EMERGENCY DEPT Provider Note   CSN: [id number] Arrival date & time: 01/25/19  1158    History   Chief Complaint Chief Complaint  Patient presents with  . Abscess    HPI Samuel Lawrence is a 23 y.o. male.     23 year old male with prior medical history as detailed below presents for wound check.  Patient reports that he had a I&D done 2 days ago at [facility].  He reports that he was sent here from his primary care office for wound check.  Patient reports that the dressing placed at [facility] is still in place.  He is compliant with his prescribed antibiotics.  He was given oxycodone for pain however he has not taken any at home.  The history is provided by the patient and medical records.  Wound Check  This is a new problem. The current episode started 2 days ago. The problem occurs constantly. The problem has not changed since onset.Pertinent negatives include no chest pain, no abdominal pain, no headaches and no shortness of breath. Nothing aggravates the symptoms. Nothing relieves the symptoms.    Past Medical History:  Diagnosis Date  . ADHD (attention deficit hyperactivity disorder)     Patient Active Problem List   Diagnosis Date Noted  . Acute appendicitis 12/29/2014    Past Surgical History:  Procedure Laterality Date  . LAPAROSCOPIC APPENDECTOMY N/A 12/29/2014   Procedure: APPENDECTOMY LAPAROSCOPIC;  Surgeon: Jimmye Norman, MD;  Location: [facility] OR;  Service: General;  Laterality: N/A;        Home Medications    Prior to Admission medications   Medication Sig Start Date End Date Taking? Authorizing Provider  clindamycin (CLEOCIN) 150 MG capsule Take 3 capsules (450 mg total) by mouth 3 (three) times daily for 10 days. 01/23/19 02/02/19 Yes Liberty Handy, PA-C  hydrocortisone cream (PREPARATION H) 1 % Apply 1 application topically 2 (two) times daily as needed for itching.   Yes [provider]   naproxen (NAPROSYN) 500 MG tablet Take 1 tablet (500 mg total) by mouth 2 (two) times daily. Patient not taking: Reported on 01/25/2019 01/30/18   Palumbo, April, MD  oxyCODONE (OXY IR/ROXICODONE) 5 MG immediate release tablet Take 1 tablet (5 mg total) by mouth every 4 (four) hours as needed for up to 3 days for severe pain. 01/23/19 01/26/19  Liberty Handy, PA-C  oxyCODONE-acetaminophen (PERCOCET/ROXICET) 5-325 MG per tablet Take 1-2 tablets by mouth every 4 (four) hours as needed for moderate pain. Patient not taking: Reported on 02/14/2015 12/30/14   Claud Kelp, MD  traMADol (ULTRAM) 50 MG tablet Take 1 tablet (50 mg total) by mouth every 6 (six) hours as needed for severe pain. Patient not taking: Reported on 01/25/2019 01/30/18   Cy Blamer, MD    Family History History reviewed. No pertinent family history.  Social History Social History   Tobacco Use  . Smoking status: Never Smoker  . Smokeless tobacco: Never Used  Substance Use Topics  . Alcohol use: Yes    Comment: social  . Drug use: Yes    Types: Marijuana     Allergies   Patient has no known allergies.   Review of Systems Review of Systems  Respiratory: Negative for shortness of breath.   Cardiovascular: Negative for chest pain.  Gastrointestinal: Negative for abdominal pain.  Neurological: Negative for headaches.  All other systems reviewed and are negative.    Physical Exam Updated Vital Signs BP  129/74   Pulse 77   Temp 98 F (36.7 C) (Oral)   Resp 16   SpO2 98%   Physical Exam Vitals signs and nursing note reviewed.  Constitutional:      General: He is not in acute distress.    Appearance: Normal appearance. He is well-developed.  HENT:     Head: Normocephalic and atraumatic.  Eyes:     Conjunctiva/sclera: Conjunctivae normal.     Pupils: Pupils are equal, round, and reactive to light.  Neck:     Musculoskeletal: Normal range of motion and neck supple.  Cardiovascular:     Rate and  Rhythm: Normal rate and regular rhythm.     Heart sounds: Normal heart sounds.  Pulmonary:     Effort: Pulmonary effort is normal. No respiratory distress.     Breath sounds: Normal breath sounds.  Abdominal:     General: There is no distension.     Palpations: Abdomen is soft.     Tenderness: There is no abdominal tenderness.  Musculoskeletal: Normal range of motion.        General: No deformity.  Skin:    General: Skin is warm and dry.     Comments: I and D site appears improved - no expressible purulence - mild erythema surrounding I and D site.   Neurological:     Mental Status: He is alert and oriented to person, place, and time.      ED Treatments / Results  Labs (all labs ordered are listed, but only abnormal results are displayed) Labs Reviewed - No data to display  EKG None  Radiology No results found.  Procedures Procedures (including critical care time)  Medications Ordered in ED Medications  morphine 2 MG/ML injection 2 mg (2 mg Intramuscular Given 01/25/19 1245)     Initial Impression / Assessment and Plan / ED Course  I have reviewed the triage vital signs and the nursing notes.  Pertinent labs & imaging results that were available during my care of the patient were reviewed by me and considered in my medical decision making (see chart for details).        MDM  Screen complete  Samuel Lawrence was evaluated in Emergency Department on 01/25/2019 for the symptoms described in the history of present illness. He was evaluated in the context of the global COVID-19 pandemic, which necessitated consideration that the patient might be at risk for infection with the SARS-CoV-2 virus that causes COVID-19. Institutional protocols and algorithms that pertain to the evaluation of patients at risk for COVID-19 are in a state of rapid change based on information released by regulatory bodies including the CDC and federal and state organizations. These policies and  algorithms were followed during the patient's care in the ED.  Patient presented for evaluation following recent I&D of perirectal abscess.  Patient without reported fever.  Patient is currently taking clindamycin.   Wound appears to be healing well. Packing removed.   Patient understands need for close follow-up.  Strict return precautions given and understood.    Patient was seen by surgical service's midlevel who agrees with ED plan and who are happy to follow-up as an outpatient.  Importance of close follow-up with surgery was reiterated multiple times.  Final Clinical Impressions(s) / ED Diagnoses   Final diagnoses:  Wound check, abscess    ED Discharge Orders    None       Wynetta FinesMessick, Laderius Valbuena C, MD 01/25/19 1339

## 2019-10-09 ENCOUNTER — Other Ambulatory Visit: Payer: PRIVATE HEALTH INSURANCE

## 2021-06-08 ENCOUNTER — Ambulatory Visit (HOSPITAL_COMMUNITY)
Admission: EM | Admit: 2021-06-08 | Discharge: 2021-06-08 | Discharge status: Against Medical Advice | Payer: PRIVATE HEALTH INSURANCE

## 2021-06-08 ENCOUNTER — Encounter (HOSPITAL_BASED_OUTPATIENT_CLINIC_OR_DEPARTMENT_OTHER): Payer: Self-pay | Admitting: Emergency Medicine

## 2021-06-08 ENCOUNTER — Emergency Department (HOSPITAL_BASED_OUTPATIENT_CLINIC_OR_DEPARTMENT_OTHER)
Admission: EM | Admit: 2021-06-08 | Discharge: 2021-06-08 | Disposition: A | Discharge status: Home / Self Care | Payer: PRIVATE HEALTH INSURANCE | Attending: Emergency Medicine | Admitting: Emergency Medicine

## 2021-06-08 ENCOUNTER — Other Ambulatory Visit: Payer: Self-pay

## 2021-06-08 ENCOUNTER — Emergency Department (HOSPITAL_BASED_OUTPATIENT_CLINIC_OR_DEPARTMENT_OTHER): Payer: PRIVATE HEALTH INSURANCE

## 2021-06-08 DIAGNOSIS — S61411A Laceration without foreign body of right hand, initial encounter: Secondary | ICD-10-CM | POA: Diagnosis not present

## 2021-06-08 DIAGNOSIS — S6991XA Unspecified injury of right wrist, hand and finger(s), initial encounter: Secondary | ICD-10-CM | POA: Diagnosis present

## 2021-06-08 DIAGNOSIS — B079 Viral wart, unspecified: Secondary | ICD-10-CM | POA: Diagnosis not present

## 2021-06-08 DIAGNOSIS — Z23 Encounter for immunization: Secondary | ICD-10-CM | POA: Diagnosis not present

## 2021-06-08 DIAGNOSIS — W228XXA Striking against or struck by other objects, initial encounter: Secondary | ICD-10-CM | POA: Insufficient documentation

## 2021-06-08 MED ORDER — TETANUS-DIPHTH-ACELL PERTUSSIS 5-2.5-18.5 LF-MCG/0.5 IM SUSY
0.5000 mL | PREFILLED_SYRINGE | Freq: Once | INTRAMUSCULAR | Status: AC
  Administered 2021-06-08: 0.5 mL via INTRAMUSCULAR
  Filled 2021-06-08: qty 0.5

## 2021-06-08 MED ORDER — CEPHALEXIN 250 MG PO CAPS
500.0000 mg | ORAL_CAPSULE | Freq: Once | ORAL | Status: AC
  Administered 2021-06-08: 500 mg via ORAL
  Filled 2021-06-08: qty 2

## 2021-06-08 MED ORDER — CEPHALEXIN 500 MG PO CAPS: 500.0000 mg | ORAL_CAPSULE | Freq: Three times a day (TID) | ORAL | 0 refills | Status: AC
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
## 2021-06-08 NOTE — Discharge Instructions (Addendum)
Keep wound clean and dry.  Take Keflex as prescribed and complete the full course.  Recommend wound recheck with your doctor in 2 days.

## 2021-06-08 NOTE — ED Notes (Signed)
Pt dc home,dc instructions and wound care reviewed with patient and patient voiced understanding.
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
## 2023-12-29 DIAGNOSIS — E669 Obesity, unspecified: Secondary | ICD-10-CM | POA: Diagnosis not present

## 2023-12-30 DIAGNOSIS — E669 Obesity, unspecified: Secondary | ICD-10-CM | POA: Diagnosis not present
